# Patient Record
Sex: Female | Born: 1985 | Race: White | Hispanic: No | Marital: Single | Smoking: Current every day smoker
Health system: Southern US, Community
[De-identification: ages and names within clinical notes are randomized; demographics above are authoritative.]

## PROBLEM LIST (undated history)

## (undated) DIAGNOSIS — I10 Essential (primary) hypertension: Secondary | ICD-10-CM

## (undated) DIAGNOSIS — N200 Calculus of kidney: Secondary | ICD-10-CM

## (undated) HISTORY — DX: Essential (primary) hypertension: I10

## (undated) HISTORY — PX: OTHER SURGICAL HISTORY: SHX169

---

## 2014-01-30 ENCOUNTER — Emergency Department (HOSPITAL_COMMUNITY)
Admission: EM | Admit: 2014-01-30 | Discharge: 2014-01-30 | Disposition: A | Discharge status: Home / Self Care | Payer: Self-pay | Attending: Emergency Medicine | Admitting: Emergency Medicine

## 2014-01-30 ENCOUNTER — Emergency Department (HOSPITAL_COMMUNITY): Payer: Self-pay

## 2014-01-30 ENCOUNTER — Encounter (HOSPITAL_COMMUNITY): Payer: Self-pay | Admitting: Emergency Medicine

## 2014-01-30 DIAGNOSIS — Z79899 Other long term (current) drug therapy: Secondary | ICD-10-CM | POA: Insufficient documentation

## 2014-01-30 DIAGNOSIS — R110 Nausea: Secondary | ICD-10-CM | POA: Insufficient documentation

## 2014-01-30 DIAGNOSIS — N949 Unspecified condition associated with female genital organs and menstrual cycle: Secondary | ICD-10-CM

## 2014-01-30 DIAGNOSIS — Y849 Medical procedure, unspecified as the cause of abnormal reaction of the patient, or of later complication, without mention of misadventure at the time of the procedure: Secondary | ICD-10-CM | POA: Insufficient documentation

## 2014-01-30 DIAGNOSIS — T8384XA Pain from genitourinary prosthetic devices, implants and grafts, initial encounter: Secondary | ICD-10-CM

## 2014-01-30 DIAGNOSIS — T8389XA Other specified complication of genitourinary prosthetic devices, implants and grafts, initial encounter: Secondary | ICD-10-CM | POA: Insufficient documentation

## 2014-01-30 DIAGNOSIS — Z87442 Personal history of urinary calculi: Secondary | ICD-10-CM | POA: Insufficient documentation

## 2014-01-30 DIAGNOSIS — F17200 Nicotine dependence, unspecified, uncomplicated: Secondary | ICD-10-CM | POA: Insufficient documentation

## 2014-01-30 DIAGNOSIS — Z3202 Encounter for pregnancy test, result negative: Secondary | ICD-10-CM | POA: Insufficient documentation

## 2014-01-30 DIAGNOSIS — R350 Frequency of micturition: Secondary | ICD-10-CM | POA: Insufficient documentation

## 2014-01-30 DIAGNOSIS — IMO0002 Reserved for concepts with insufficient information to code with codable children: Secondary | ICD-10-CM | POA: Insufficient documentation

## 2014-01-30 DIAGNOSIS — R300 Dysuria: Secondary | ICD-10-CM | POA: Insufficient documentation

## 2014-01-30 DIAGNOSIS — N9489 Other specified conditions associated with female genital organs and menstrual cycle: Secondary | ICD-10-CM | POA: Insufficient documentation

## 2014-01-30 HISTORY — DX: Calculus of kidney: N20.0

## 2014-01-30 LAB — URINALYSIS, ROUTINE W REFLEX MICROSCOPIC
Bilirubin Urine: NEGATIVE
Glucose, UA: NEGATIVE mg/dL
Hgb urine dipstick: NEGATIVE
Ketones, ur: NEGATIVE mg/dL
LEUKOCYTES UA: NEGATIVE
NITRITE: NEGATIVE
PH: 7 (ref 5.0–8.0)
Protein, ur: NEGATIVE mg/dL
Specific Gravity, Urine: 1.006 (ref 1.005–1.030)
Urobilinogen, UA: 0.2 mg/dL (ref 0.0–1.0)

## 2014-01-30 LAB — WET PREP, GENITAL
Trich, Wet Prep: NONE SEEN
Yeast Wet Prep HPF POC: NONE SEEN

## 2014-01-30 LAB — PREGNANCY, URINE: Preg Test, Ur: NEGATIVE

## 2014-01-30 MED ORDER — ONDANSETRON 4 MG PO TBDP
4.0000 mg | ORAL_TABLET | Freq: Once | ORAL | Status: AC
  Administered 2014-01-30: 4 mg via ORAL
  Filled 2014-01-30: qty 1

## 2014-01-30 MED ORDER — PHENAZOPYRIDINE HCL 200 MG PO TABS: 200.0000 mg | ORAL_TABLET | Freq: Three times a day (TID) | ORAL | Status: DC

## 2014-01-30 NOTE — ED Provider Notes (Signed)
CSN: [id number]     Arrival date & time 01/30/14  1639 History   First MD Initiated Contact with Patient 01/30/14 2001     Chief Complaint  Patient presents with  . Urinary Frequency  . Dysuria  . painful intercourse      (Consider location/radiation/quality/duration/timing/severity/associated sxs/prior Treatment) HPI Pt is a 28yo female with hx of renal stones presenting to ED c/o urinary frequency associated with burning with urination and intercourse. Also c/o nausea. Pt states urinary symptoms and external vaginal burning started about 3 days ago, however, reports painful intercourse since having an IUD placed about 1 year ago in [place].  Pt states she moved to the area a few months ago and has not established a PCP or GYN yet. Pt also reports "little bumps" near vaginal opening that she noticed 3 days.  Reports not using condoms during intercourse as she has been with the same partner "for a while"  But states "it wouldn't hurt" to check for STDs.  Denies fever or vomiting.   Past Medical History  Diagnosis Date  . Kidney stone    Past Surgical History  Procedure Laterality Date  . Cesarean section     No family history on file. History  Substance Use Topics  . Smoking status: Current Every Day Smoker  . Smokeless tobacco: Not on file  . Alcohol Use: Yes   OB History   Grav Para Term Preterm Abortions TAB SAB Ect Mult Living                 Review of Systems  Constitutional: Negative for fever and chills.  Gastrointestinal: Positive for nausea. Negative for vomiting, abdominal pain and diarrhea.  Genitourinary: Positive for dysuria, urgency, frequency, vaginal discharge, vaginal pain and menstrual problem ( slight brown discharge with ovulation). Negative for hematuria, flank pain, decreased urine volume, vaginal bleeding and pelvic pain.  Musculoskeletal: Negative for back pain and myalgias.  All other systems reviewed and are negative.     Allergies  Review  of patient's allergies indicates no known allergies.  Home Medications   Prior to Admission medications   Medication Sig Start Date End Date Taking? Authorizing Provider  phenazopyridine (PYRIDIUM) 200 MG tablet Take 1 tablet (200 mg total) by mouth 3 (three) times daily. 01/30/14   Junius FinnerErin O'Malley, PA-C   BP 163/108  Pulse 70  Temp(Src) 98.5 F (36.9 C) (Oral)  Resp 18  SpO2 100%  LMP 01/02/2014 Physical Exam  Nursing note and vitals reviewed. Constitutional: She appears well-developed and well-nourished. No distress.  Pt sitting on exam bed, appears well, non-toxic. NAD.  HENT:  Head: Normocephalic and atraumatic.  Eyes: Conjunctivae are normal. No scleral icterus.  Neck: Normal range of motion.  Cardiovascular: Normal rate, regular rhythm and normal heart sounds.   Pulmonary/Chest: Effort normal and breath sounds normal. No respiratory distress. She has no wheezes. She has no rales. She exhibits no tenderness.  Abdominal: Soft. Bowel sounds are normal. She exhibits no distension and no mass. There is no tenderness. There is no rebound and no guarding.  Soft, non-distended, non-tender. No CVAT  Genitourinary:  Chaperoned exam. External genitalia, 2 pin-point open sores. One on right labia major, one right side of vaginal opening.  Vaginal exam- minimal to moderate amount of thick white-mucous discharge. No CMT, adnexal tenderness or masses. No vaginal bleeding. Unable to visualize cervical os.   Musculoskeletal: Normal range of motion.  Neurological: She is alert.  Skin: Skin is warm and dry. She  is not diaphoretic.    ED Course  Procedures (including critical care time) Labs Review Labs Reviewed  WET PREP, GENITAL - Abnormal; Notable for the following:    Clue Cells Wet Prep HPF POC FEW (*)    WBC, Wet Prep HPF POC FEW (*)    All other components within normal limits  GC/CHLAMYDIA PROBE AMP  URINALYSIS, ROUTINE W REFLEX MICROSCOPIC  PREGNANCY, URINE  RPR  HIV ANTIBODY  (ROUTINE TESTING)  HSV 2 ANTIBODY, IGG  HSV 1 ANTIBODY, IGG    Imaging Review Dg Abd 1 View  01/30/2014   CLINICAL DATA:  Urinary frequency. Dysuria. Evaluate birth control location.  EXAM: ABDOMEN - 1 VIEW  COMPARISON:  None.  FINDINGS: Intrauterine device is localized to the central pelvis. Specific information regarding positioning is not possible radiographically. The bowel gas pattern is normal. No radio-opaque calculi or other significant radiographic abnormality are seen.  IMPRESSION: Nonobstructive bowel gas pattern. Intrauterine device demonstrated in the central pelvis.   Electronically Signed   By: Burman NievesWilliam  Stevens M.D.   On: 01/30/2014 22:22     EKG Interpretation None      MDM   Final diagnoses:  Dysuria  Genital lesion, female  Pain due to intrauterine contraceptive device (IUD), initial encounter    Pt is a 28yo female c/o pain with intercourse since having IUD placed 1 year ago with worsening urinary symptoms of burning with urination and external genital lesions within last 3 days.  Pt does report shaving but unsure if lesions are from her shaving. Pt has minimal concern for STDs however states, while she is here she would like to be checked. On exam, pt does have 2 pinpoint open lesions spaced out on right side of external genitalia.  Do not appear vesicular in nature.  Not classic appearance for herpes.  STD panel with HIV, RPR, GC/chlamydia, and HSV 1 and HSV 2 tests ordered.  Wet prep: unremarkable.  UA: WNL Urine preg: negative.  KUB confirmed placement IUD.  Pt states she was able to palpate IUD strings, although not able to be visualized during pelvic exam.  Discussed pt with Dr. Lynelle DoctorKnapp, will discharge pt home with pyridium and advised [facility]. Return precautions provided. Pt verbalized understanding and agreement with tx plan.     Junius Finnerrin O'Malley, PA-C 01/31/14 [phone]

## 2014-01-30 NOTE — Discharge Instructions (Signed)
For skin sores and dysuria, you may find relief taking a warm bath with baking soda added to water.  You may also cover open sores with baby powder to help protect from moisture. Be sure to follow up with a gynecologist for recheck of dysuria and possible IUD removal if symptoms not improving.

## 2014-01-30 NOTE — ED Notes (Signed)
Vangie BickerErin O, PA at bedside.

## 2014-01-30 NOTE — ED Notes (Signed)
Pt c/o urinary frequency, burning and painful intercourse. Denies bleeding, states she did have slight brown discharge during ovulation. Pt states she recently got IUD a year ago.

## 2014-01-31 LAB — HIV ANTIBODY (ROUTINE TESTING W REFLEX): HIV 1&2 Ab, 4th Generation: NONREACTIVE

## 2014-01-31 LAB — RPR

## 2014-01-31 LAB — GC/CHLAMYDIA PROBE AMP
CT Probe RNA: NEGATIVE
GC Probe RNA: NEGATIVE

## 2014-01-31 LAB — HSV 1 ANTIBODY, IGG: HSV 1 Glycoprotein G Ab, IgG: <0.1 IV

## 2014-01-31 LAB — HSV 2 ANTIBODY, IGG: HSV 2 Glycoprotein G Ab, IgG: 0.55 IV

## 2014-01-31 NOTE — ED Provider Notes (Signed)
Medical screening examination/treatment/procedure(s) were performed by non-physician practitioner and as supervising physician I was immediately available for consultation/collaboration.   EKG Interpretation None      Devoria AlbeIva Knapp, MD, Armando GangFACEP   Ward GivensIva L Knapp, MD 01/31/14 [phone]

## 2014-02-02 ENCOUNTER — Telehealth (HOSPITAL_COMMUNITY): Payer: Self-pay

## 2015-07-03 DIAGNOSIS — F900 Attention-deficit hyperactivity disorder, predominantly inattentive type: Secondary | ICD-10-CM | POA: Insufficient documentation

## 2015-12-03 IMAGING — CR DG ABDOMEN 1V
1 series · 1 of 1 positions shown · non-contrast
Comparison: None.

CLINICAL DATA: Urinary frequency. Dysuria. Evaluate birth control
location.

EXAM:
ABDOMEN - 1 VIEW

[t abdomen supine]
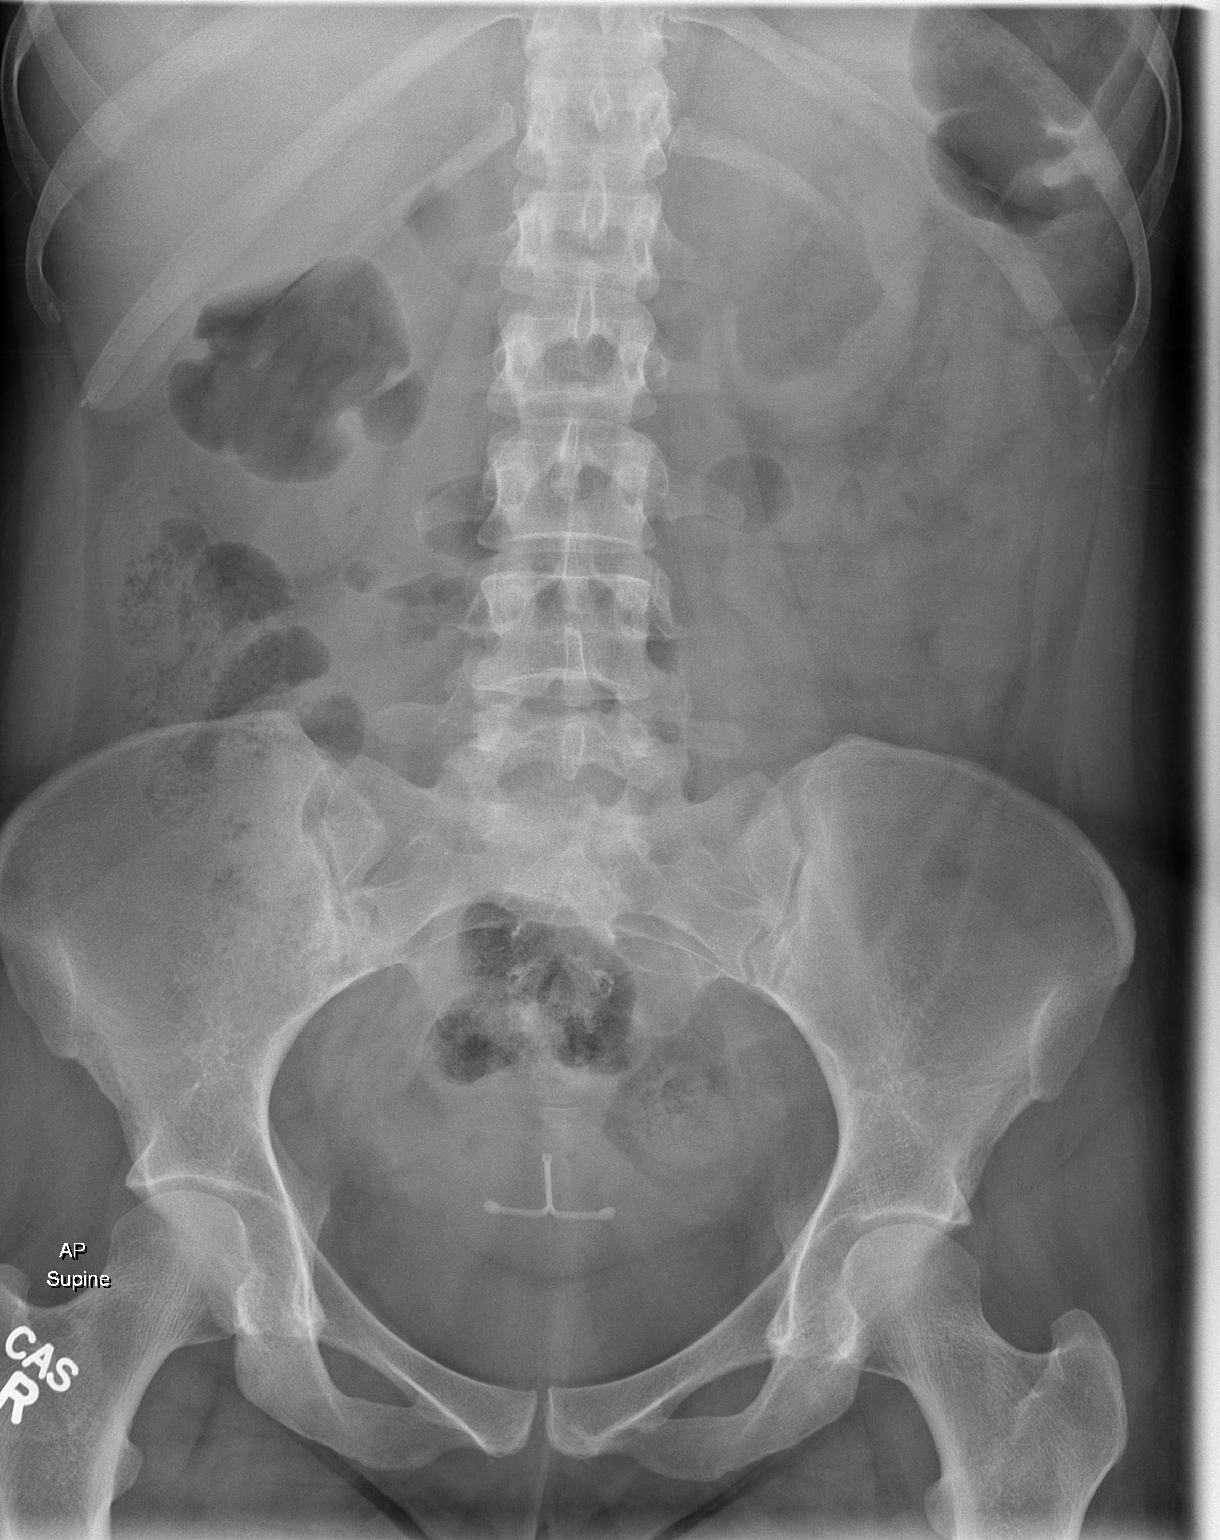

[1 of 1 positions shown; findings below may reference images not displayed]

FINDINGS: Intrauterine device is localized to the central pelvis. Specific
information regarding positioning is not possible radiographically.
The bowel gas pattern is normal. No radio-opaque calculi or other
significant radiographic abnormality are seen.
IMPRESSION: Nonobstructive bowel gas pattern. Intrauterine device demonstrated
in the central pelvis.

## 2016-09-17 DIAGNOSIS — N200 Calculus of kidney: Secondary | ICD-10-CM | POA: Insufficient documentation

## 2016-09-17 DIAGNOSIS — F1290 Cannabis use, unspecified, uncomplicated: Secondary | ICD-10-CM | POA: Insufficient documentation

## 2016-09-17 DIAGNOSIS — Z720 Tobacco use: Secondary | ICD-10-CM | POA: Insufficient documentation

## 2020-08-11 DIAGNOSIS — I639 Cerebral infarction, unspecified: Secondary | ICD-10-CM

## 2020-08-11 HISTORY — DX: Cerebral infarction, unspecified: I63.9

## 2020-12-20 ENCOUNTER — Emergency Department
Admission: EM | Admit: 2020-12-20 | Discharge: 2020-12-20 | Disposition: A | Discharge status: Home / Self Care | Payer: Medicaid Other | Attending: Emergency Medicine | Admitting: Emergency Medicine

## 2020-12-20 ENCOUNTER — Encounter: Payer: Self-pay | Admitting: Emergency Medicine

## 2020-12-20 ENCOUNTER — Other Ambulatory Visit: Payer: Self-pay

## 2020-12-20 DIAGNOSIS — Z5321 Procedure and treatment not carried out due to patient leaving prior to being seen by health care provider: Secondary | ICD-10-CM | POA: Insufficient documentation

## 2020-12-20 DIAGNOSIS — F329 Major depressive disorder, single episode, unspecified: Secondary | ICD-10-CM | POA: Diagnosis not present

## 2020-12-20 NOTE — ED Notes (Addendum)
Pt was sat into recliner in 24H. Acknowledged patient and let her know that the doctor will be around to see her shortly. This RN cleaning room 23 at the time. Pt asks if she can be moved into room 24. Told pt no due to patient currently being in that room. Pt begins cursing and states " I can't sit out here listening to that fucking shit" in reference to monitors. Informed pt that she was placed in 24H to see a doctor because of lack of other rooms at the time and that the doctor would like to see and evaluate her. Offered for patient to move to room 23 and explained that we would need to change her into behavioral clothing for safety measures and secure belongings. Pt refused, cursing at RN, and aggressively walked out of the emergency room. Informed that Pt in lobby requesting to call sheriff office to take her back home because that is who allegedly brought pt to ER voluntarily. Pt left with all of belongings.

## 2020-12-20 NOTE — ED Triage Notes (Signed)
Pt comes into the ED via POV c/o increased depression.  Pt denies any SI or HI but states that she has had increased depression for the past couple weeks.  Pt states she is also unsure if she is pregnant.  Pt states that her depression has increased since the COVID pandemic.  PT states she is also having a lot of nightmares.

## 2020-12-20 NOTE — ED Notes (Addendum)
Pt out to the front desk , states she needs for [place] to call for the sheriff officer to pick her up and take her back home, pt states "I cant be back there with the beep beep of the monitors, that why I am here from trauma of watch covid patients dying"

## 2021-10-24 ENCOUNTER — Ambulatory Visit (INDEPENDENT_AMBULATORY_CARE_PROVIDER_SITE_OTHER): Payer: Medicaid Other | Admitting: Family Medicine

## 2021-10-24 ENCOUNTER — Encounter: Payer: Self-pay | Admitting: Family Medicine

## 2021-10-24 ENCOUNTER — Other Ambulatory Visit: Payer: Self-pay

## 2021-10-24 VITALS — BP 160/90 | HR 67 | Ht 63.0 in | Wt 141.2 lb

## 2021-10-24 DIAGNOSIS — S069X9A Unspecified intracranial injury with loss of consciousness of unspecified duration, initial encounter: Secondary | ICD-10-CM

## 2021-10-24 DIAGNOSIS — Z8673 Personal history of transient ischemic attack (TIA), and cerebral infarction without residual deficits: Secondary | ICD-10-CM

## 2021-10-24 DIAGNOSIS — I639 Cerebral infarction, unspecified: Secondary | ICD-10-CM | POA: Insufficient documentation

## 2021-10-24 DIAGNOSIS — R350 Frequency of micturition: Secondary | ICD-10-CM | POA: Diagnosis not present

## 2021-10-24 DIAGNOSIS — I10 Essential (primary) hypertension: Secondary | ICD-10-CM | POA: Diagnosis not present

## 2021-10-24 DIAGNOSIS — F3170 Bipolar disorder, currently in remission, most recent episode unspecified: Secondary | ICD-10-CM

## 2021-10-24 MED ORDER — LISINOPRIL 10 MG PO TABS: 10.0000 mg | ORAL_TABLET | Freq: Every day | ORAL | 1 refills | Status: DC

## 2021-10-24 NOTE — Assessment & Plan Note (Signed)
Patient with stated longstanding history of hypertension, was noted to be hypertensive today.  Previous management with lisinopril and other medications that she cannot recall.  She is asymptomatic from a cardiopulmonary standpoint. ? ?Exam reveals positive S1 and S2, regular rate and rhythm, no additional heart sounds, clear lung fields throughout without wheezes, rales, rhonchi, no JVD or carotid bruits, symmetric pulses of the extremities, no peripheral edema. ? ?Plan for initial dose of lisinopril 10 mg, close follow-up in 1 month, and risk stratification labs. ?

## 2021-10-24 NOTE — Progress Notes (Signed)
?  ? ?  Primary Care / Sports Medicine Office Visit ? ?Patient Information:  ?Patient ID: Shannon Ray, female DOB: 11/11/1985 Age: 36 y.o. MRN: [id number]  ? ?Shannon Ray is a pleasant 36 y.o. female presenting with the following: ? ?Chief Complaint  ?Patient presents with  ? Establish Care  ?  08/2020-stroke, brain bleed. Unable to get records from [place]. Hasn't had BP, in months. Urinates a lot since having Stroke, no pain.   ? Cough  ?  Deep cough 1 week  ? ? ?Vitals:  ? 10/24/21 1426  ?BP: (!) 160/90  ?Pulse: 67  ?SpO2: 99%  ? ?Vitals:  ? 10/24/21 1426  ?Weight: 141 lb 3.2 oz (64 kg)  ?Height: 5\' 3"  (1.6 m)  ? ?Body mass index is 25.01 kg/m?. ? ?No results found.  ? ?Independent interpretation of notes and tests performed by another provider:  ? ?None ? ?Procedures performed:  ? ?None ? ?Pertinent History, Exam, Impression, and Recommendations:  ? ?Cerebrovascular accident (CVA) (HCC) ?[name] presents for establishment of care from [facility], she does have a history of CVA, unspecified, this was managed initially in an emergency department setting with subsequent admission and care in the ICU.  Unfortunately, patient states that after awakening she left AMA and was otherwise followed by her PCP in [place].  We do not have outside medical records at this time though she does give a history of hypertension and is found to be hypertensive today. ? ?Cranial nerve testing is benign today and no overt sensorimotor deficits are noted.  That being said, plan for referral to both neurology and cardiology given her stated clinical course for further risk stratification and management as indicated. ? ?Frequency of urination ?Noted over the past 2 days, denies any overt dysuria, no fevers, no chills, no discharge.  Denies any new exposures or risk factors otherwise.  Plan for urinalysis with reflex to culture and labs. ? ?Hypertension ?Patient with stated longstanding history of hypertension, was noted to be  hypertensive today.  Previous management with lisinopril and other medications that she cannot recall.  She is asymptomatic from a cardiopulmonary standpoint. ? ?Exam reveals positive S1 and S2, regular rate and rhythm, no additional heart sounds, clear lung fields throughout without wheezes, rales, rhonchi, no JVD or carotid bruits, symmetric pulses of the extremities, no peripheral edema. ? ?Plan for initial dose of lisinopril 10 mg, close follow-up in 1 month, and risk stratification labs.  ? ?Orders & Medications ?Meds ordered this encounter  ?Medications  ? lisinopril (ZESTRIL) 10 MG tablet  ?  Sig: Take 1 tablet (10 mg total) by mouth daily.  ?  Dispense:  30 tablet  ?  Refill:  1  ? ?Orders Placed This Encounter  ?Procedures  ? Apo A1 + B + Ratio  ? CBC  ? Comprehensive metabolic panel  ? Lipid panel  ? TSH  ? UA/M w/rflx Culture, Comp  ? Ambulatory referral to Gynecology  ? Ambulatory referral to Cardiology  ? Ambulatory referral to Neurology  ?  ? ?Return in about 4 weeks (around 11/21/2021) for Annual Physical.  ?  ? ?11/23/2021, MD ? ? Primary Care Sports Medicine ?[facility] ?[facility]  ? ?

## 2021-10-24 NOTE — Patient Instructions (Signed)
-   Obtain fasting labs with orders provided ?- Restart lisinopril at 10 mg daily ?- Referral coordinator will contact you in regards to scheduling a follow-up visit with neurology and cardiology ?- Return for annual physical in 4 weeks ?- Contact us for any questions between now and then ?

## 2021-10-24 NOTE — Assessment & Plan Note (Signed)
Noted over the past 2 days, denies any overt dysuria, no fevers, no chills, no discharge.  Denies any new exposures or risk factors otherwise.  Plan for urinalysis with reflex to culture and labs. ?

## 2021-10-24 NOTE — Assessment & Plan Note (Signed)
Ms. Bonus presents for establishment of care from [facility], she does have a history of CVA, unspecified, this was managed initially in an emergency department setting with subsequent admission and care in the ICU.  Unfortunately, patient states that after awakening she left AMA and was otherwise followed by her PCP in [place].  We do not have outside medical records at this time though she does give a history of hypertension and is found to be hypertensive today. ? ?Cranial nerve testing is benign today and no overt sensorimotor deficits are noted.  That being said, plan for referral to both neurology and cardiology given her stated clinical course for further risk stratification and management as indicated. ?

## 2021-10-25 LAB — LIPID PANEL
Chol/HDL Ratio: 4 ratio (ref 0.0–4.4)
Cholesterol, Total: 226 mg/dL — ABNORMAL HIGH (ref 100–199)
HDL: 57 mg/dL (ref >39)
LDL Chol Calc (NIH): 145 mg/dL — ABNORMAL HIGH (ref 0–99)
Triglycerides: 133 mg/dL (ref 0–149)
VLDL Cholesterol Cal: 24 mg/dL (ref 5–40)

## 2021-10-25 LAB — UA/M W/RFLX CULTURE, COMP
Bilirubin, UA: NEGATIVE
Glucose, UA: NEGATIVE
Ketones, UA: NEGATIVE
Leukocytes,UA: NEGATIVE
Nitrite, UA: NEGATIVE
Protein,UA: NEGATIVE
RBC, UA: NEGATIVE
Specific Gravity, UA: 1.011 (ref 1.005–1.030)
Urobilinogen, Ur: 0.2 mg/dL (ref 0.2–1.0)
pH, UA: 7.5 (ref 5.0–7.5)

## 2021-10-25 LAB — COMPREHENSIVE METABOLIC PANEL
ALT: 6 IU/L (ref 0–32)
AST: 15 IU/L (ref 0–40)
Albumin/Globulin Ratio: 1.8 (ref 1.2–2.2)
Albumin: 4.6 g/dL (ref 3.8–4.8)
Alkaline Phosphatase: 66 IU/L (ref 44–121)
BUN/Creatinine Ratio: 14 (ref 9–23)
BUN: 10 mg/dL (ref 6–20)
Bilirubin Total: 0.3 mg/dL (ref 0.0–1.2)
CO2: 22 mmol/L (ref 20–29)
Calcium: 9.1 mg/dL (ref 8.7–10.2)
Chloride: 101 mmol/L (ref 96–106)
Creatinine, Ser: 0.71 mg/dL (ref 0.57–1.00)
Globulin, Total: 2.5 g/dL (ref 1.5–4.5)
Glucose: 82 mg/dL (ref 70–99)
Potassium: 4.2 mmol/L (ref 3.5–5.2)
Sodium: 136 mmol/L (ref 134–144)
Total Protein: 7.1 g/dL (ref 6.0–8.5)
eGFR: 112 mL/min/{1.73_m2} (ref >59)

## 2021-10-25 LAB — CBC
Hematocrit: 41.9 % (ref 34.0–46.6)
Hemoglobin: 14.2 g/dL (ref 11.1–15.9)
MCH: 32.4 pg (ref 26.6–33.0)
MCHC: 33.9 g/dL (ref 31.5–35.7)
MCV: 96 fL (ref 79–97)
Platelets: 271 10*3/uL (ref 150–450)
RBC: 4.38 x10E6/uL (ref 3.77–5.28)
RDW: 12.2 % (ref 11.7–15.4)
WBC: 6.7 10*3/uL (ref 3.4–10.8)

## 2021-10-25 LAB — MICROSCOPIC EXAMINATION
Bacteria, UA: NONE SEEN
Casts: NONE SEEN /lpf
WBC, UA: NONE SEEN /hpf (ref 0–5)

## 2021-10-25 LAB — TSH: TSH: 2.75 u[IU]/mL (ref 0.450–4.500)

## 2021-10-25 LAB — APO A1 + B + RATIO
Apolipo. B/A-1 Ratio: 0.8 ratio — ABNORMAL HIGH (ref 0.0–0.6)
Apolipoprotein A-1: 144 mg/dL (ref 116–209)
Apolipoprotein B: 118 mg/dL — ABNORMAL HIGH (ref <90)

## 2021-10-28 ENCOUNTER — Other Ambulatory Visit: Payer: Self-pay | Admitting: Family Medicine

## 2021-10-28 DIAGNOSIS — E782 Mixed hyperlipidemia: Secondary | ICD-10-CM

## 2021-10-28 MED ORDER — ROSUVASTATIN CALCIUM 10 MG PO TABS: 10.0000 mg | ORAL_TABLET | Freq: Every day | ORAL | 3 refills | Status: DC

## 2021-10-29 ENCOUNTER — Other Ambulatory Visit: Payer: Self-pay | Admitting: Family Medicine

## 2021-10-29 DIAGNOSIS — Z30011 Encounter for initial prescription of contraceptive pills: Secondary | ICD-10-CM

## 2021-10-29 MED ORDER — NORETHIN-ETH ESTRAD-FE BIPHAS 1 MG-10 MCG / 10 MCG PO TABS: 1.0000 | ORAL_TABLET | Freq: Every day | ORAL | 11 refills | Status: DC

## 2021-10-29 NOTE — Progress Notes (Signed)
Pt hasn't been on any in a couple years.

## 2021-11-11 ENCOUNTER — Encounter: Payer: Self-pay | Admitting: Licensed Practical Nurse

## 2021-11-11 ENCOUNTER — Other Ambulatory Visit (HOSPITAL_COMMUNITY)
Admission: RE | Admit: 2021-11-11 | Discharge: 2021-11-11 | Disposition: A | Discharge status: Home / Self Care | Payer: Medicaid Other | Source: Ambulatory Visit | Attending: Licensed Practical Nurse | Admitting: Licensed Practical Nurse

## 2021-11-11 ENCOUNTER — Ambulatory Visit: Payer: Medicaid Other | Admitting: Licensed Practical Nurse

## 2021-11-11 VITALS — BP 122/70 | Ht 63.0 in | Wt 140.0 lb

## 2021-11-11 DIAGNOSIS — Z30011 Encounter for initial prescription of contraceptive pills: Secondary | ICD-10-CM | POA: Diagnosis not present

## 2021-11-11 DIAGNOSIS — Z113 Encounter for screening for infections with a predominantly sexual mode of transmission: Secondary | ICD-10-CM

## 2021-11-11 DIAGNOSIS — N926 Irregular menstruation, unspecified: Secondary | ICD-10-CM

## 2021-11-11 DIAGNOSIS — Z124 Encounter for screening for malignant neoplasm of cervix: Secondary | ICD-10-CM | POA: Diagnosis present

## 2021-11-11 DIAGNOSIS — Z01419 Encounter for gynecological examination (general) (routine) without abnormal findings: Secondary | ICD-10-CM | POA: Diagnosis present

## 2021-11-11 LAB — POCT URINE PREGNANCY: Preg Test, Ur: NEGATIVE

## 2021-11-11 MED ORDER — NORETHINDRONE 0.35 MG PO TABS: 1.0000 | ORAL_TABLET | Freq: Every day | ORAL | 11 refills | Status: DC

## 2021-11-11 NOTE — Progress Notes (Signed)
? ? ? ?Gynecology Annual Exam   ?PCP: Jerrol BananaMatthews, Jason J, MD ? ?Chief Complaint:  ?Chief Complaint  ?Patient presents with  ? Gynecologic Exam  ? ? ?History of Present Illness: Patient is a 36 y.o. G3P0 presents for annual exam. The patient is her today for a pap and to discuss birthcontrol. She was prescribed OCP's by her PCP.  ? ?LMP: Patient's last menstrual period was 10/13/2021 (approximate). ?Average Interval: regular, 28 days ?Duration of flow:  4-5  days ?Heavy Menses: no ?Clots: no ?Intermenstrual Bleeding: no ?Postcoital Bleeding: no ?Dysmenorrhea: no ? ?The patient is sexually active with 1 female partner.  She currently uses none for contraception. She denies dyspareunia.  The patient does perform self breast exams.  There is no notable family history of breast or ovarian cancer in her family. ? ?The patient wears seatbelts: yes.   The patient has regular exercise:  tries to walk a mile a day  .   ? ?The patient denies current symptoms of depression.  Does have hx of bipolar and ADHD ?Currently not working ?Lives with her child's father, feels safe ?Describes stress as low, likes to watch TV and go for walks to decrease stress. ?Does smoke 10 cigarettes/day ?Hx of stoke ?CHTN  ? ?Review of Systems: Review of Systems  ?Constitutional:  Negative for chills and fever.  ?Respiratory:  Positive for cough.   ?Genitourinary:  Positive for frequency.  ?Musculoskeletal:   ?     Left breast pain secondary to cough   ?Neurological: Negative.   ?Endo/Heme/Allergies:   ?     Hot flashes   ?Psychiatric/Behavioral: Negative.    ? ?Past Medical History:  ?Patient Active Problem List  ? Diagnosis Date Noted  ? Cerebrovascular accident (CVA) (HCC) 10/24/2021  ? Frequency of urination 10/24/2021  ? SAH (subarachnoid hemorrhage) (HCC) 08/10/2020  ? Missed abortion 11/17/2019  ? Bipolar disorder, currently in remission, most recent episode unspecified (HCC) 11/18/2016  ? Vitamin D deficiency 10/08/2016  ? ADD (attention  deficit disorder) 09/17/2016  ?  Formatting of this note might be different from the original. ?was taking Adderall 30mg  twice daily. Has tried to discontinue, but states she is worse without the medication. Currently taking 1/4 tab daily. Managed by Psychiatrist.  Appt scheduled with Dr. Marvetta GibbonsBurleson 09/17/16 to discuss. ?  ? 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  Formatting of this note might be different from the original. 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 States she had pneumonia as a child & was hospitalized with fever up to 106 degrees. ?  ? 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  Formatting of this note might be different from the original. ?Diagnosed 2013 during work up for renal stones ?  ? History of C-section 09/17/2016  ?  Formatting of this note might be different from the original. ?LTCS @ 30 weeks with G1 for Severe Pre-E: undecided if she wants Repeat or VBAC. [facility] [place] ?  ? History of severe pre-eclampsia 09/17/2016  ?  Formatting of this note might be different from the original. ?Severe Pre-Eclampsia with G1 requiring emergency C-section @ 30 weeks. Treated with Magnesium Sulfate. ?  ? Marijuana use 09/17/2016  ?  Formatting of this note might be different from the original. ?Last used 08/11/16 ?UDS 09/16/16 ?Needs UDS 28 & 36 weeks ?  ? Polysubstance abuse (HCC) 09/17/2016  ?  Formatting of this note might be different from the original. ?  2013 Heroin, opiates ?admission to detox facilities ?  ? Renal stones 09/17/2016  ?  Formatting of this note might be different from the original. ?2013 ?  ? Smoker 09/17/2016  ?  Formatting of this note might be different from the original. ?2 cigs/day, plans to quit ?  ? Hypertension 12/05/2009  ? 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  Formatting of this note might be different from the original. ?Narcolepsy ? ?10/1 IMO update ?  ? Obsessive-compulsive disorder 11/07/2009  ?  Formatting of  this note might be different from the original. ?Obsessive Compulsive Disorder ? ?10/1 IMO update ?  ? ? ?Past Surgical History:  ?Past Surgical History:  ?Procedure Laterality Date  ? BRAIN SURGERY N/A 08/2020  ? CESAREAN SECTION    ? kidney stone N/A   ? ? ?Gynecologic History:  ?Patient's last menstrual period was 10/13/2021 (approximate). ?Contraception: none ?Last Pap: Results were: no abnormalities collected about 3 years ago at [facility]  ? ?Obstetric History: G3P0 ? ?Family History:  ?Family History  ?Problem Relation Age of Onset  ? Hypertension Mother   ? Brain cancer Paternal Aunt   ? ? ?Social History:  ?Social History  ? ?Socioeconomic History  ? Marital status: Single  ?  Spouse name: Not on file  ? Number of children: Not on file  ? Years of education: Not on file  ? Highest education level: Not on file  ?Occupational History  ? Not on file  ?Tobacco Use  ? Smoking status: Every Day  ?  Types: Cigarettes  ? Smokeless tobacco: Never  ?Vaping Use  ? Vaping Use: Never used  ?Substance and Sexual Activity  ? Alcohol use: Not Currently  ? Drug use: Not Currently  ? Sexual activity: Yes  ?Other Topics Concern  ? Not on file  ?Social History Narrative  ? Not on file  ? ?Social Determinants of Health  ? ?Financial Resource Strain: Not on file  ?Food Insecurity: Not on file  ?Transportation Needs: Not on file  ?Physical Activity: Not on file  ?Stress: Not on file  ?Social Connections: Not on file  ?Intimate Partner Violence: Not on file  ? ? ?Allergies:  ?No Known Allergies ? ?Medications: ?Prior to Admission medications   ?Medication Sig Start Date End Date Taking? Authorizing Provider  ?lisinopril (ZESTRIL) 10 MG tablet Take 1 tablet (10 mg total) by mouth daily. 10/24/21  Yes [name], MD  ?norethindrone (ORTHO MICRONOR) 0.35 MG tablet Take 1 tablet (0.35 mg total) by mouth daily. 11/11/21  Yes Taji Sather, Courtney Heys, CNM  ?phenazopyridine (PYRIDIUM) 200 MG tablet Take 1 tablet (200 mg  total) by mouth 3 (three) times daily. ?Patient not taking: Reported on 10/24/2021 01/30/14   Lurene Shadow, PA-C  ?rosuvastatin (CRESTOR) 10 MG tablet Take 1 tablet (10 mg total) by mouth daily. ?Patient not taking: Reported on 11/11/2021 10/28/21   [name], MD  ? ? ?Physical Exam ?Vitals: Blood pressure 122/70, height 5\' 3"  (1.6 m), weight 140 lb (63.5 kg), last menstrual period 10/13/2021. ? ?General: NAD ?HEENT: normocephalic, anicteric ?Thyroid: no enlargement, no palpable nodules ?Pulmonary: No increased work of breathing, CTAB ?Cardiovascular: RRR, distal pulses 2+ ?Breast: Breast symmetrical, no tenderness, no palpable nodules or masses, no skin or nipple retraction present, no nipple discharge.  No axillary or supraclavicular lymphadenopathy. ?Abdomen: NABS, soft, non-tender, non-distended.  Umbilicus without lesions.  No hepatomegaly, splenomegaly or masses palpable. No evidence of hernia  ?Genitourinary: ? External: Normal  external female genitalia.  Normal urethral meatus, normal Bartholin's and Skene's glands.   ? Vagina: Normal vaginal mucosa, no evidence of prolapse.   ? Cervix: Grossly normal in appearance, no bleeding ? Uterus: Non-enlarged, mobile, normal contour.  No CMT ? Adnexa: ovaries non-enlarged, no adnexal masses ? Rectal: deferred ? Lymphatic: no evidence of inguinal lymphadenopathy ?Extremities: no edema, erythema, or tenderness ?Neurologic: Grossly intact ?Psychiatric: mood appropriate, affect full ? ?Female chaperone present for pelvic and breast  portions of the physical exam ? ? ? ?Assessment: 36 y.o. G3P0 routine annual exam ? ?Plan: ?Problem List Items Addressed This Visit   ?None ?Visit Diagnoses   ? ? Well woman exam    -  Primary  ? Relevant Medications  ? norethindrone (ORTHO MICRONOR) 0.35 MG tablet  ? Other Relevant Orders  ? HEP, RPR, HIV Panel  ? Hepatitis C antibody  ? Cytology - PAP  ? Screening examination for venereal disease      ? Relevant Orders  ? HEP, RPR, HIV  Panel  ? Hepatitis C antibody  ? Cervical cancer screening      ? Relevant Orders  ? Cytology - PAP  ? Initiation of oral contraception      ? Relevant Medications  ? norethindrone (ORTHO MICRONOR) 0.35 MG

## 2021-11-12 ENCOUNTER — Telehealth: Payer: Self-pay

## 2021-11-12 LAB — HEP, RPR, HIV PANEL
HIV Screen 4th Generation wRfx: NONREACTIVE
Hepatitis B Surface Ag: NEGATIVE
RPR Ser Ql: NONREACTIVE

## 2021-11-12 LAB — HEPATITIS C ANTIBODY: Hep C Virus Ab: NONREACTIVE

## 2021-11-12 NOTE — Telephone Encounter (Signed)
I contacted patient via phone to scheduled BTL consult with PC. Called and left voicemail for patient to call back to be scheduled.

## 2021-11-13 LAB — CYTOLOGY - PAP
Chlamydia: NEGATIVE
Comment: NEGATIVE
Comment: NEGATIVE
Comment: NEGATIVE
Comment: NORMAL
Diagnosis: NEGATIVE
Diagnosis: REACTIVE
High risk HPV: NEGATIVE
Neisseria Gonorrhea: NEGATIVE
Trichomonas: NEGATIVE

## 2021-11-13 NOTE — Telephone Encounter (Signed)
Patient is scheduled for 12/11/21 with PC

## 2021-11-14 ENCOUNTER — Ambulatory Visit: Payer: Medicaid Other | Admitting: Cardiology

## 2021-11-14 ENCOUNTER — Encounter: Payer: Self-pay | Admitting: Cardiology

## 2021-11-14 VITALS — BP 130/88 | HR 77 | Ht 63.0 in | Wt 140.0 lb

## 2021-11-14 DIAGNOSIS — E7800 Pure hypercholesterolemia, unspecified: Secondary | ICD-10-CM | POA: Diagnosis not present

## 2021-11-14 DIAGNOSIS — I10 Essential (primary) hypertension: Secondary | ICD-10-CM

## 2021-11-14 DIAGNOSIS — IMO0001 Reserved for inherently not codable concepts without codable children: Secondary | ICD-10-CM

## 2021-11-14 DIAGNOSIS — I639 Cerebral infarction, unspecified: Secondary | ICD-10-CM

## 2021-11-14 DIAGNOSIS — R072 Precordial pain: Secondary | ICD-10-CM

## 2021-11-14 DIAGNOSIS — F17200 Nicotine dependence, unspecified, uncomplicated: Secondary | ICD-10-CM | POA: Diagnosis not present

## 2021-11-14 NOTE — Patient Instructions (Signed)
Medication Instructions:  ?No changes at this time.  ? ?*If you need a refill on your cardiac medications before your next appointment, please call your pharmacy* ? ? ?Lab Work: ?None ? ?If you have labs (blood work) drawn today and your tests are completely normal, you will receive your results only by: ?MyChart Message (if you have MyChart) OR ?A paper copy in the mail ?If you have any lab test that is abnormal or we need to change your treatment, we will call you to review the results. ? ? ?Testing/Procedures: ?Your physician has requested that you have an echocardiogram.  ? ?Echocardiography is a painless test that uses sound waves to create images of your heart. It provides your doctor with information about the size and shape of your heart and how well your heart?s chambers and valves are working. This procedure takes approximately one hour. There are no restrictions for this procedure. ? ? ? ?Follow-Up: ?At [facility], you and your health needs are our priority.  As part of our continuing mission to provide you with exceptional heart care, we have created designated Provider Care Teams.  These Care Teams include your primary Cardiologist (physician) and Advanced Practice Providers (APPs -  Physician Assistants and Nurse Practitioners) who all work together to provide you with the care you need, when you need it. ? ? ?Your next appointment:   ?Follow up after testing.  ? ?The format for your next appointment:   ?In Person ? ?Provider:   ?Debbe Odea, MD ?

## 2021-11-14 NOTE — Progress Notes (Signed)
?Cardiology Office Note:   ? ?Date:  11/14/2021  ? ?IDPrecious Ray:  Shannon Ray, DOB [birth year], MRN [id number] ? ?PCP:  Jerrol BananaMatthews, Jason J, MD ?  ?[facility] Providers ?Cardiologist:  None    ? ?Referring MD: Jerrol BananaMatthews, Jason J, MD  ? ?Chief Complaint  ?Patient presents with  ? New Patient (Initial Visit)  ?  Referred by PCP for family CVA. Meds reviewed verbally with patient.   ? ? ?History of Present Illness:   ? ?Shannon Ray is a 36 y.o. 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 ? ?Patient states having hemorrhagic stroke 1 year ago.  She was sitting at home and suddenly her abdomen became normal.  She later fell, her daughter's father who was at the time with the patient call EMS.  [place] was taken to a hospital in [place] , and [facility] she states undergoing coil embolization.  He does not remember much from diet, states being on too many meds and left AMA. ? ?She moved to the area and establish care with PCP.  She denies shortness of breath, states having occasional nonspecific left-sided chest pain when she bends.  Recent blood work revealed elevated cholesterol, started on Crestor.  Also started on lisinopril for elevated BP. ? ?Past Medical History:  ?Diagnosis Date  ? Hypertension   ? Kidney stone   ? Stroke (cerebrum) (HCC) 08/2020  ? ? ?Past Surgical History:  ?Procedure Laterality Date  ? BRAIN SURGERY N/A 08/2020  ? CESAREAN SECTION    ? kidney stone N/A   ? ? ?Current Medications: ?Current Meds  ?Medication Sig  ? lisinopril (ZESTRIL) 10 MG tablet Take 1 tablet (10 mg total) by mouth daily.  ? norethindrone (ORTHO MICRONOR) 0.35 MG tablet Take 1 tablet (0.35 mg total) by mouth daily.  ? rosuvastatin (CRESTOR) 10 MG tablet Take 1 tablet (10 mg total) by mouth daily.  ?  ? ?Allergies:   Patient has no known allergies.  ? ?Social History  ? ?Socioeconomic History   ? Marital status: Single  ?  Spouse name: Not on file  ? Number of children: Not on file  ? Years of education: Not on file  ? Highest education level: Not on file  ?Occupational History  ? Not on file  ?Tobacco Use  ? Smoking status: Every Day  ?  Types: Cigarettes  ? Smokeless tobacco: Never  ?Vaping Use  ? Vaping Use: Never used  ?Substance and Sexual Activity  ? Alcohol use: Not Currently  ? Drug use: Not Currently  ? Sexual activity: Yes  ?Other Topics Concern  ? Not on file  ?Social History Narrative  ? Not on file  ? ?Social Determinants of Health  ? ?Financial Resource Strain: Not on file  ?Food Insecurity: Not on file  ?Transportation Needs: Not on file  ?Physical Activity: Not on file  ?Stress: Not on file  ?Social Connections: Not on file  ?  ? ?Family History: ?The patient's family history includes Brain cancer in her paternal aunt; Hypertension in her mother. ? ?ROS:   ?Please see the history of present illness.    ? All other systems reviewed and are negative. ? ?EKGs/Labs/Other Studies Reviewed:   ? ?The following studies were reviewed today: ? ? ?EKG:  EKG is  ordered today.  The ekg ordered today demonstrates normal sinus rhythm, normal ECG. ? ?Recent Labs: ?10/24/2021:  ALT 6; BUN 10; Creatinine, Ser 0.71; Hemoglobin 14.2; Platelets 271; Potassium 4.2; Sodium 136; TSH 2.750  ?Recent Lipid Panel ?   ?Component Value Date/Time  ? CHOL 226 (H) 10/24/2021 1539  ? TRIG 133 10/24/2021 1539  ? HDL 57 10/24/2021 1539  ? CHOLHDL 4.0 10/24/2021 1539  ? LDLCALC 145 (H) 10/24/2021 1539  ? ? ? ?Risk Assessment/Calculations:   ? ? ?    ? ?Physical Exam:   ? ?VS:  BP 130/88 (BP Location: Left Arm, Patient Position: Sitting, Cuff Size: Normal)   Pulse 77   Ht 5\' 3"  (1.6 m)   Wt 140 lb (63.5 kg)   SpO2 95%   BMI 24.80 kg/m?    ? ?Wt Readings from Last 3 Encounters:  ?11/14/21 140 lb (63.5 kg)  ?11/11/21 140 lb (63.5 kg)  ?10/24/21 141 lb 3.2 oz (64 kg)  ?  ? ?GEN:  Well nourished, well developed in no acute  distress ?HEENT: Normal ?NECK: No JVD; No carotid bruits ?LYMPHATICS: No lymphadenopathy ?CARDIAC: RRR, no murmurs, rubs, gallops ?RESPIRATORY:  Clear to auscultation without rales, wheezing or rhonchi  ?ABDOMEN: Soft, non-tender, non-distended ?MUSCULOSKELETAL:  No edema; No deformity  ?SKIN: Warm and dry ?NEUROLOGIC:  Alert and oriented x 3 ?PSYCHIATRIC:  Normal affect  ? ?ASSESSMENT:   ? ?1. Precordial pain   ?2. Primary hypertension   ?3. Pure hypercholesterolemia   ?4. Smoking   ?5. Cerebrovascular accident (CVA), unspecified mechanism (HCC)   ? ?PLAN:   ? ?In order of problems listed above: ? ?Chest pain, not consistent with angina.  Has several cardiac risk factors.  Get echocardiogram to evaluate cardiac function.  If normal, plan yearly monitoring.  Recommend management of cardiovascular risk factors as below. ?Hypertension, BP controlled.  Continue lisinopril. ?Hyperlipidemia, agree with starting Crestor. ?Current smoker, cessation advised. ?History of hemorrhagic CVA, brain aneurysm.  Referral to neurology as per PCP. ? ?Follow-up after echocardiogram. ? ?   ? ? ?Medication Adjustments/Labs and Tests Ordered: ?Current medicines are reviewed at length with the patient today.  Concerns regarding medicines are outlined above.  ?Orders Placed This Encounter  ?Procedures  ? EKG 12-Lead  ? ECHOCARDIOGRAM COMPLETE  ? ?No orders of the defined types were placed in this encounter. ? ? ?Patient Instructions  ?Medication Instructions:  ?No changes at this time.  ? ?*If you need a refill on your cardiac medications before your next appointment, please call your pharmacy* ? ? ?Lab Work: ?None ? ?If you have labs (blood work) drawn today and your tests are completely normal, you will receive your results only by: ?MyChart Message (if you have MyChart) OR ?A paper copy in the mail ?If you have any lab test that is abnormal or we need to change your treatment, we will call you to review the  results. ? ? ?Testing/Procedures: ?Your physician has requested that you have an echocardiogram.  ? ?Echocardiography is a painless test that uses sound waves to create images of your heart. It provides your doctor with information about the size and shape of your heart and how well your heart?s chambers and valves are working. This procedure takes approximately one hour. There are no restrictions for this procedure. ? ? ? ?Follow-Up: ?At [facility], you and your health needs are our priority.  As part of our continuing mission to provide you with exceptional heart care, we have created designated Provider Care Teams.  These Care Teams include your primary Cardiologist (physician) and Advanced Practice Providers (APPs -  Physician Assistants and Nurse Practitioners) who all work together to provide you with the care you need, when you need it. ? ? ?Your next appointment:   ?Follow up after testing.  ? ?The format for your next appointment:   ?In Person ? ?Provider:   ?Debbe Odea, MD  ? ?Signed, ?Debbe Odea, MD  ?11/14/2021 12:01 PM    ?[facility] ?

## 2021-11-21 ENCOUNTER — Encounter: Payer: Self-pay | Admitting: Family Medicine

## 2021-11-21 ENCOUNTER — Ambulatory Visit (INDEPENDENT_AMBULATORY_CARE_PROVIDER_SITE_OTHER): Payer: Medicaid Other | Admitting: Family Medicine

## 2021-11-21 VITALS — BP 118/78 | HR 78 | Ht 63.0 in | Wt 140.0 lb

## 2021-11-21 DIAGNOSIS — Z0000 Encounter for general adult medical examination without abnormal findings: Secondary | ICD-10-CM | POA: Diagnosis not present

## 2021-11-21 DIAGNOSIS — Z720 Tobacco use: Secondary | ICD-10-CM

## 2021-11-21 DIAGNOSIS — R051 Acute cough: Secondary | ICD-10-CM | POA: Diagnosis not present

## 2021-11-21 DIAGNOSIS — E782 Mixed hyperlipidemia: Secondary | ICD-10-CM

## 2021-11-21 DIAGNOSIS — I10 Essential (primary) hypertension: Secondary | ICD-10-CM | POA: Diagnosis not present

## 2021-11-21 DIAGNOSIS — I639 Cerebral infarction, unspecified: Secondary | ICD-10-CM

## 2021-11-21 MED ORDER — METHYLPREDNISOLONE 4 MG PO TBPK: ORAL_TABLET | ORAL | 0 refills | Status: AC

## 2021-11-21 NOTE — Assessment & Plan Note (Signed)
Longstanding history of the same in the setting of hypertension, hyperlipidemia, and prior CVA.  We did discuss the risk of continued tobacco use, methods for tobacco cessation including pharmacologic means.  She is not ready to quit at this time and I encouraged her to contact her office once ready to do so.  A total duration of 6 minutes was utilized for this discussion. ?

## 2021-11-21 NOTE — Patient Instructions (Signed)
-   Obtain fasting labs with orders provided (can have water or black coffee but otherwise no food or drink x 8 hours before labs) ?-Use Mucinex x5 days ?- If respiratory symptoms persist after 5 days, start steroids and continue Mucinex ?- If symptoms persist after steroids, contact our office ?- Referral coordinator will contact you regards to physical therapy/Occupational Therapy scheduling ?- Follow-up with neurology once scheduled, maintain follow-up with cardiology and OB/GYN ?- Review information provided ?- Attend eye doctor annually, dentist every 6 months, work towards or maintain 30 minutes of moderate intensity physical activity at least 5 days per week, and consume a balanced diet ?- Return in 3 months- Contact us for any questions between now and then ?

## 2021-11-21 NOTE — Assessment & Plan Note (Signed)
Noted over the past few days, nonproductive, mild congestion, no fevers, chills, cannot confirm any sick contacts, no myalgias, no treatments.  Given her reassuring examination findings have advised Mucinex course x5 days, Rx sent for prednisone for patient to initiate if symptoms continue without improvement given her smoking history.  She is to contact us for any residual symptoms beyond that timeframe, chest x-ray to be considered at that time. ?

## 2021-11-21 NOTE — Assessment & Plan Note (Signed)
At last visit on 10/24/2021 referrals to cardiology and neurology have been placed.  She has had a chance to establish with cardiology, has since also been started on both lisinopril and statin through our office.  She has upcoming visit with neurology.  Noted central tone/strength issues that patient reports are new since CVA and most likely represented a sequela of the same.  I have placed a referral to physical therapy and Occupational Therapy to begin working on the noted strength deficits as well as aiding in her ADLs.  This can be further optimized by neurology once she establishes with them. ?

## 2021-11-21 NOTE — Assessment & Plan Note (Signed)
Chronic condition noting excellent control today on lisinopril 10 mg monotherapy, has established with cardiology who does have upcoming echocardiogram scheduled.  She is asymptomatic from a cardiac standpoint. ?

## 2021-11-21 NOTE — Assessment & Plan Note (Signed)
Noted on routine risk stratification labs as part of her CVA and hypertension history/work-up.  She was started on Crestor 10 mg our office, tolerated this medication well, plan to recheck lipids and CMP in 3 months at her return. ?

## 2021-11-21 NOTE — Progress Notes (Signed)
?  ? ?Annual Physical Exam Visit ? ?Patient Information:  ?Patient ID: Shannon Ray, female DOB: 1985/10/20 Age: 36 y.o. MRN: [id number]  ? ?Subjective:  ? ?CC: Annual Physical Exam ? ?HPI:  ?Shannon Ray is here for their annual physical. ? ?I reviewed the past medical history, family history, social history, surgical history, and allergies today and changes were made as necessary.  Please see the problem list section below for additional details. ? ?Past Medical History: ?Past Medical History:  ?Diagnosis Date  ? Hypertension   ? Kidney stone   ? Stroke (cerebrum) (HCC) 08/2020  ? ?Past Surgical History: ?Past Surgical History:  ?Procedure Laterality Date  ? BRAIN SURGERY N/A 08/2020  ? CESAREAN SECTION    ? kidney stone N/A   ? ?Family History: ?Family History  ?Problem Relation Age of Onset  ? Hyperlipidemia Mother   ? Hypertension Mother   ? Hyperlipidemia Father   ? Brain cancer Paternal Aunt   ? ?Allergies: ?No Known Allergies ?Health Maintenance: ?Health Maintenance  ?Topic Date Due  ? TETANUS/TDAP  11/22/2022 (Originally 01/28/2003)  ? INFLUENZA VACCINE  03/11/2022  ? PAP SMEAR-Modifier  11/11/2024  ? Hepatitis C Screening  Completed  ? HIV Screening  Completed  ? HPV VACCINES  Aged Out  ? COVID-19 Vaccine  Discontinued  ?  ?HM Colonoscopy   ? ? This patient has no relevant Health Maintenance data.  ? ?  ? ?Medications: ?Current Outpatient Medications on File Prior to Visit  ?Medication Sig Dispense Refill  ? lisinopril (ZESTRIL) 10 MG tablet Take 1 tablet (10 mg total) by mouth daily. 30 tablet 1  ? norethindrone (ORTHO MICRONOR) 0.35 MG tablet Take 1 tablet (0.35 mg total) by mouth daily. 28 tablet 11  ? rosuvastatin (CRESTOR) 10 MG tablet Take 1 tablet (10 mg total) by mouth daily. 90 tablet 3  ? ?No current facility-administered medications on file prior to visit.  ? ? ?Review of Systems: No headache, visual changes, nausea, vomiting, diarrhea, constipation, dizziness, abdominal pain, skin rash, fevers,  chills, night sweats, swollen lymph nodes, weight loss, chest pain, body aches, joint swelling, muscle aches, shortness of breath, +cough, mood changes, visual or auditory hallucinations reported. ? ?Objective:  ? ?Vitals:  ? 11/21/21 0908  ?BP: 118/78  ?Pulse: 78  ?SpO2: 98%  ? ?Vitals:  ? 11/21/21 0908  ?Weight: 140 lb (63.5 kg)  ?Height: 5\' 3"  (1.6 m)  ? ?Body mass index is 24.8 kg/m?. ? ?General: Well Developed, well nourished, and in no acute distress.  ?Neuro: Alert and oriented x3, extra-ocular muscles intact, sensation grossly intact. Cranial nerves II through XII are grossly intact, motor, sensory, and coordinative functions are grossly intact. Diminished strength during hip/lumbosacral flexion/extension. ?HEENT: Normocephalic, atraumatic, pupils equal round reactive to light, neck supple, no masses, no lymphadenopathy, thyroid nonpalpable. Oropharynx, nasopharynx, external ear canals are unremarkable. ?Skin: Warm and dry, no rashes noted.  ?Cardiac: Regular rate and rhythm, no murmurs rubs or gallops. No peripheral edema. Pulses symmetric. ?Respiratory: Clear to auscultation bilaterally.  Intermittent cough, not using accessory muscles, speaking in full sentences.  ?Abdominal: Soft, nontender, nondistended, positive bowel sounds, no masses, no organomegaly. ?Musculoskeletal: Shoulder, elbow, wrist, hip, knee, ankle stable, and with full range of motion.   ? ?Female chaperone initials: KH present throughout the physical examination. ? ?Impression and Recommendations:  ? ?The patient was counselled, risk factors were discussed, and anticipatory guidance given. ? ?Hypertension ?Chronic condition noting excellent control today on lisinopril 10 mg monotherapy, has established  with cardiology who does have upcoming echocardiogram scheduled.  She is asymptomatic from a cardiac standpoint. ? ?Annual physical exam ?Annual examination completed, risk stratification labs ordered over interim visit and reviewed with  patient today, anticipatory guidance provided. ? ?Acute cough ?Noted over the past few days, nonproductive, mild congestion, no fevers, chills, cannot confirm any sick contacts, no myalgias, no treatments.  Given her reassuring examination findings have advised Mucinex course x5 days, Rx sent for prednisone for patient to initiate if symptoms continue without improvement given her smoking history.  She is to contact us for any residual symptoms beyond that timeframe, chest x-ray to be considered at that time. ? ?Cerebrovascular accident (CVA) (HCC) ?At last visit on 10/24/2021 referrals to cardiology and neurology have been placed.  She has had a chance to establish with cardiology, has since also been started on both lisinopril and statin through our office.  She has upcoming visit with neurology.  Noted central tone/strength issues that patient reports are new since CVA and most likely represented a sequela of the same.  I have placed a referral to physical therapy and Occupational Therapy to begin working on the noted strength deficits as well as aiding in her ADLs.  This can be further optimized by neurology once she establishes with them. ? ?Tobacco abuse ?Longstanding history of the same in the setting of hypertension, hyperlipidemia, and prior CVA.  We did discuss the risk of continued tobacco use, methods for tobacco cessation including pharmacologic means.  She is not ready to quit at this time and I encouraged her to contact her office once ready to do so.  A total duration of 6 minutes was utilized for this discussion. ? ?Mixed hyperlipidemia ?Noted on routine risk stratification labs as part of her CVA and hypertension history/work-up.  She was started on Crestor 10 mg our office, tolerated this medication well, plan to recheck lipids and CMP in 3 months at her return. ? ?Orders & Medications ?Medications:  ?Meds ordered this encounter  ?Medications  ? methylPREDNISolone (MEDROL DOSEPAK) 4 MG TBPK tablet   ?  Sig: Take 6 tablets (24 mg total) by mouth daily for 1 day, THEN 5 tablets (20 mg total) daily for 1 day, THEN 4 tablets (16 mg total) daily for 1 day, THEN 3 tablets (12 mg total) daily for 1 day, THEN 2 tablets (8 mg total) daily for 1 day, THEN 1 tablet (4 mg total) daily for 1 day.  ?  Dispense:  21 tablet  ?  Refill:  0  ? ?Orders Placed This Encounter  ?Procedures  ? Ambulatory referral to Physical Therapy  ? Ambulatory referral to Occupational Therapy  ?  ? ?Return in about 3 months (around 02/20/2022).  ? ? ?[name], MD ? ? Primary Care Sports Medicine ?[facility] ?[facility]  ? ?

## 2021-11-21 NOTE — Assessment & Plan Note (Signed)
Annual examination completed, risk stratification labs ordered over interim visit and reviewed with patient today, anticipatory guidance provided. ?

## 2021-11-22 ENCOUNTER — Ambulatory Visit (INDEPENDENT_AMBULATORY_CARE_PROVIDER_SITE_OTHER): Payer: Medicaid Other

## 2021-11-22 DIAGNOSIS — I639 Cerebral infarction, unspecified: Secondary | ICD-10-CM

## 2021-11-22 DIAGNOSIS — R072 Precordial pain: Secondary | ICD-10-CM

## 2021-11-22 LAB — ECHOCARDIOGRAM COMPLETE
AR max vel: 1.95 cm2
AV Area VTI: 1.85 cm2
AV Area mean vel: 1.82 cm2
AV Mean grad: 5 mmHg
AV Peak grad: 8.1 mmHg
Ao pk vel: 1.42 m/s
Area-P 1/2: 3.2 cm2
Calc EF: 62.2 %
S' Lateral: 3.2 cm
Single Plane A2C EF: 65.4 %
Single Plane A4C EF: 57.3 %

## 2021-12-11 ENCOUNTER — Ambulatory Visit: Payer: Medicaid Other | Admitting: Obstetrics and Gynecology

## 2021-12-12 ENCOUNTER — Telehealth: Payer: Self-pay | Admitting: Obstetrics and Gynecology

## 2021-12-12 NOTE — Telephone Encounter (Signed)
Pt is scheduled with [facility] on May 16 for Nexplanon placement.   ?

## 2021-12-13 ENCOUNTER — Ambulatory Visit: Payer: Medicaid Other | Admitting: Cardiology

## 2021-12-13 ENCOUNTER — Encounter: Payer: Self-pay | Admitting: Cardiology

## 2021-12-13 VITALS — BP 130/80 | HR 64 | Ht 63.0 in | Wt 139.0 lb

## 2021-12-13 DIAGNOSIS — F17200 Nicotine dependence, unspecified, uncomplicated: Secondary | ICD-10-CM | POA: Diagnosis not present

## 2021-12-13 DIAGNOSIS — R072 Precordial pain: Secondary | ICD-10-CM

## 2021-12-13 DIAGNOSIS — E7800 Pure hypercholesterolemia, unspecified: Secondary | ICD-10-CM | POA: Diagnosis not present

## 2021-12-13 DIAGNOSIS — I10 Essential (primary) hypertension: Secondary | ICD-10-CM

## 2021-12-13 NOTE — Progress Notes (Signed)
?Cardiology Office Note:   ? ?Date:  12/13/2021  ? ?Shannon Ray, DOB June 05, 1986, MRN [id number] ? ?PCP:  Shannon Culver, MD ?  ?[facility] Providers ?Cardiologist:  None    ? ?Referring MD: Shannon Culver, MD  ? ?Chief Complaint  ?Patient presents with  ? Other  ?  Follow up post ECHO -- Meds reviewed verbally with patient.   ? ? ?History of Present Illness:   ? ?Shannon Ray is a 36 y.o. 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  Previously seen due to chest pain. ? ?Patient had chest pain typically when she bends.  Due to risk factors, echocardiogram was obtained.  Symptoms not deemed cardiac in etiology.  She feels okay, takes medications as prescribed.  Recently started on Crestor for hyperlipidemia.  Has a follow-up appointment with primary care provider regarding this.  Also takes lisinopril, blood pressures are improved. ? ?  ?Prior notes ?Echo 11/2021 EF 60 to 65% ? she was taken to a hospital in [place] , and was subsequently transferred to [facility] where she states undergoing coil embolization.  Left left AMA from the hospital at that time. ? ? ?Past Medical History:  ?Diagnosis Date  ? Hypertension   ? Kidney stone   ? Stroke (cerebrum) ([place]) 08/2020  ? ? ?Past Surgical History:  ?Procedure Laterality Date  ? BRAIN SURGERY N/A 08/2020  ? CESAREAN SECTION    ? kidney stone N/A   ? ? ?Current Medications: ?Current Meds  ?Medication Sig  ? lisinopril (ZESTRIL) 10 MG tablet Take 1 tablet (10 mg total) by mouth daily.  ? norethindrone (ORTHO MICRONOR) 0.35 MG tablet Take 1 tablet (0.35 mg total) by mouth daily.  ? rosuvastatin (CRESTOR) 10 MG tablet Take 1 tablet (10 mg total) by mouth daily.  ?  ? ?Allergies:   Patient has no known allergies.  ? ?Social History  ? ?Socioeconomic History  ? Marital status: Single  ?  Spouse name: Not on file  ? Number of children: Not on  file  ? Years of education: Not on file  ? Highest education level: Not on file  ?Occupational History  ? Not on file  ?Tobacco Use  ? Smoking status: Every Day  ?  Types: Cigarettes  ? Smokeless tobacco: Never  ?Vaping Use  ? Vaping Use: Never used  ?Substance and Sexual Activity  ? Alcohol use: Not Currently  ? Drug use: Not Currently  ? Sexual activity: Yes  ?Other Topics Concern  ? Not on file  ?Social History Narrative  ? Not on file  ? ?Social Determinants of Health  ? ?Financial Resource Strain: Not on file  ?Food Insecurity: Not on file  ?Transportation Needs: Not on file  ?Physical Activity: Not on file  ?Stress: Not on file  ?Social Connections: Not on file  ?  ? ?Family History: ?The patient's family history includes Brain cancer in her paternal aunt; Hyperlipidemia in her father and mother; Hypertension in her mother. ? ?ROS:   ?Please see the history of present illness.    ? All other systems reviewed and are negative. ? ?EKGs/Labs/Other Studies Reviewed:   ? ?The following studies were reviewed today: ? ? ?EKG:  EKG is  ordered today.  The ekg ordered today demonstrates normal sinus rhythm, normal ECG. ? ?Recent Labs: ?10/24/2021: ALT 6; BUN 10; Creatinine, Ser 0.71; Hemoglobin 14.2; Platelets 271;  Potassium 4.2; Sodium 136; TSH 2.750  ?Recent Lipid Panel ?   ?Component Value Date/Time  ? CHOL 226 (H) 10/24/2021 1539  ? TRIG 133 10/24/2021 1539  ? HDL 57 10/24/2021 1539  ? CHOLHDL 4.0 10/24/2021 1539  ? LDLCALC 145 (H) 10/24/2021 1539  ? ? ? ?Risk Assessment/Calculations:   ? ? ?    ? ?Physical Exam:   ? ?VS:  BP 130/80 (BP Location: Left Arm, Patient Position: Sitting, Cuff Size: Normal)   Pulse 64   Ht 5\' 3"  (1.6 m)   Wt 139 lb (63 kg)   SpO2 98%   BMI 24.62 kg/m?    ? ?Wt Readings from Last 3 Encounters:  ?12/13/21 139 lb (63 kg)  ?11/21/21 140 lb (63.5 kg)  ?11/14/21 140 lb (63.5 kg)  ?  ? ?GEN:  Well nourished, well developed in no acute distress ?HEENT: Normal ?NECK: No JVD; No carotid  bruits ?LYMPHATICS: No lymphadenopathy ?CARDIAC: RRR, no murmurs, rubs, gallops ?RESPIRATORY:  Clear to auscultation without rales, wheezing or rhonchi  ?ABDOMEN: Soft, non-tender, non-distended ?MUSCULOSKELETAL:  No edema; No deformity  ?SKIN: Warm and dry ?NEUROLOGIC:  Alert and oriented x 3 ?PSYCHIATRIC:  Normal affect  ? ?ASSESSMENT:   ? ?1. Precordial pain   ?2. Primary hypertension   ?3. Pure hypercholesterolemia   ?4. Smoking   ? ? ?PLAN:   ? ?In order of problems listed above: ? ?Chest pain, not consistent with angina.  Echocardiogram 11/2021 EF 60 to 65%.  No findings to suggest etiology of chest pain.  Currently denies any symptoms. ?Hypertension, BP controlled.  Continue lisinopril. ?Hyperlipidemia, continue Crestor.  Follow-up with PCP regarding medication titration if needed. ?Current smoker, cessation advised. ? ?Follow-up as needed ? ?   ? ?Medication Adjustments/Labs and Tests Ordered: ?Current medicines are reviewed at length with the patient today.  Concerns regarding medicines are outlined above.  ?No orders of the defined types were placed in this encounter. ? ?No orders of the defined types were placed in this encounter. ? ? ?Patient Instructions  ?Medication Instructions:  ? ?Your physician recommends that you continue on your current medications as directed. Please refer to the Current Medication list given to you today. ? ?*If you need a refill on your cardiac medications before your next appointment, please call your pharmacy* ? ? ?Lab Work: ? ?None Ordered ? ?If you have labs (blood work) drawn today and your tests are completely normal, you will receive your results only by: ?MyChart Message (if you have MyChart) OR ?A paper copy in the mail ?If you have any lab test that is abnormal or we need to change your treatment, we will call you to review the results. ? ? ?Testing/Procedures: ? ?None Ordered ? ? ?Follow-Up: ?At [facility], you and your health needs are our priority.  As part of  our continuing mission to provide you with exceptional heart care, we have created designated Provider Care Teams.  These Care Teams include your primary Cardiologist (physician) and Advanced Practice Providers (APPs -  Physician Assistants and Nurse Practitioners) who all work together to provide you with the care you need, when you need it. ? ?We recommend signing up for the patient portal called "MyChart".  Sign up information is provided on this After Visit Summary.  MyChart is used to connect with patients for Virtual Visits (Telemedicine).  Patients are able to view lab/test results, encounter notes, upcoming appointments, etc.  Non-urgent messages can be sent to your provider as  well.   ?To learn more about what you can do with MyChart, go to [email or web address].   ? ?Your next appointment:  AS NEEDED ? ?Important Information About Sugar ? ? ? ? ? ?  ? ?Signed, ?Kate Sable, MD  ?12/13/2021 11:50 AM    ?[place] ?

## 2021-12-13 NOTE — Patient Instructions (Signed)
Medication Instructions:  ? ?Your physician recommends that you continue on your current medications as directed. Please refer to the Current Medication list given to you today. ? ?*If you need a refill on your cardiac medications before your next appointment, please call your pharmacy* ? ? ?Lab Work: ? ?None Ordered ? ?If you have labs (blood work) drawn today and your tests are completely normal, you will receive your results only by: ?MyChart Message (if you have MyChart) OR ?A paper copy in the mail ?If you have any lab test that is abnormal or we need to change your treatment, we will call you to review the results. ? ? ?Testing/Procedures: ? ?None Ordered ? ? ?Follow-Up: ?At [facility], you and your health needs are our priority.  As part of our continuing mission to provide you with exceptional heart care, we have created designated Provider Care Teams.  These Care Teams include your primary Cardiologist (physician) and Advanced Practice Providers (APPs -  Physician Assistants and Nurse Practitioners) who all work together to provide you with the care you need, when you need it. ? ?We recommend signing up for the patient portal called "MyChart".  Sign up information is provided on this After Visit Summary.  MyChart is used to connect with patients for Virtual Visits (Telemedicine).  Patients are able to view lab/test results, encounter notes, upcoming appointments, etc.  Non-urgent messages can be sent to your provider as well.   ?To learn more about what you can do with MyChart, go to [email or web address].   ? ?Your next appointment:  AS NEEDED ? ?Important Information About Sugar ? ? ? ? ? ? ?

## 2021-12-16 NOTE — Telephone Encounter (Signed)
Noted. Will order to arrive by apt date/time. 

## 2021-12-24 ENCOUNTER — Encounter: Payer: Self-pay | Admitting: Obstetrics and Gynecology

## 2021-12-24 ENCOUNTER — Ambulatory Visit: Payer: Medicaid Other | Admitting: Obstetrics and Gynecology

## 2021-12-24 ENCOUNTER — Encounter: Payer: Self-pay | Admitting: Family Medicine

## 2021-12-24 VITALS — BP 120/80 | Ht 63.0 in | Wt 138.0 lb

## 2021-12-24 DIAGNOSIS — B9689 Other specified bacterial agents as the cause of diseases classified elsewhere: Secondary | ICD-10-CM | POA: Diagnosis not present

## 2021-12-24 DIAGNOSIS — Z30014 Encounter for initial prescription of intrauterine contraceptive device: Secondary | ICD-10-CM

## 2021-12-24 DIAGNOSIS — N760 Acute vaginitis: Secondary | ICD-10-CM | POA: Diagnosis not present

## 2021-12-24 DIAGNOSIS — R399 Unspecified symptoms and signs involving the genitourinary system: Secondary | ICD-10-CM

## 2021-12-24 DIAGNOSIS — N898 Other specified noninflammatory disorders of vagina: Secondary | ICD-10-CM | POA: Diagnosis not present

## 2021-12-24 LAB — POCT WET PREP WITH KOH
Clue Cells Wet Prep HPF POC: POSITIVE
KOH Prep POC: NEGATIVE
Trichomonas, UA: NEGATIVE
Yeast Wet Prep HPF POC: NEGATIVE

## 2021-12-24 LAB — POCT URINALYSIS DIPSTICK
Bilirubin, UA: NEGATIVE
Blood, UA: NEGATIVE
Glucose, UA: NEGATIVE
Ketones, UA: NEGATIVE
Leukocytes, UA: NEGATIVE
Nitrite, UA: NEGATIVE
Protein, UA: NEGATIVE
Spec Grav, UA: 1.025 (ref 1.010–1.025)
pH, UA: 6 (ref 5.0–8.0)

## 2021-12-24 LAB — POCT URINE PREGNANCY: Preg Test, Ur: NEGATIVE

## 2021-12-24 MED ORDER — METRONIDAZOLE 500 MG PO TABS: ORAL_TABLET | ORAL | 0 refills | Status: DC

## 2021-12-24 MED ORDER — FLUCONAZOLE 150 MG PO TABS: 150.0000 mg | ORAL_TABLET | Freq: Once | ORAL | 0 refills | Status: AC

## 2021-12-24 MED ORDER — MISOPROSTOL 100 MCG PO TABS: 100.0000 ug | ORAL_TABLET | Freq: Once | ORAL | 0 refills | Status: DC

## 2021-12-24 NOTE — Telephone Encounter (Signed)
Please advise 

## 2021-12-24 NOTE — Progress Notes (Signed)
? ? ?Shannon Banana, MD ? ? ?Chief Complaint  ?Patient presents with  ? Contraception  ?  Nexplanon insertion  ? Vaginal Discharge  ?  Fishy odor, no itchiness x 5 days  ? ? ?HPI: ?     [name] is a 36 y.o. G3P0 whose LMP was Patient's last menstrual period was 12/04/2021 (approximate)., presents today for increased vag d/c with fishy odor, no irritation for 5-6 days; with urinary pressure/urgency with vag sx. Hx of BV in past. Also with painful area vaginal opening, looks red to pt. Went to ED due to vaginal pain and had neg wet prep. UA suggestive of UTI so treated with keflex, waiting on C&S results. They were also suspicious for PID so treated her with rocephin inj. Awaiting STD testing per pt (can't see in Epic).  ?Pt would also like nexplanon today. Was seen for annual 4/23 and had referral for TL. Pt doesn't want to do all that and would like nexplanon placed instead. Pt with hx of CVA/"brain bleed" and HTN. Nexpanon has Cat 3 recommendation per CDC for hx of CVA. Pt to see neuro tomorrow. Had Mirena IUD in past and did well for 3 yrs then started to have pain and had to have emergently removed. Was started on POPs at 4/23 annual. Has had irregular bleeding, new chin hair. Wants to be off pills.  ? ? ?Patient Active Problem List  ? Diagnosis Date Noted  ? Mixed hyperlipidemia 11/21/2021  ? Acute cough 11/21/2021  ? Annual physical exam 11/21/2021  ? Cerebrovascular accident (CVA) (HCC) 10/24/2021  ? Frequency of urination 10/24/2021  ? SAH (subarachnoid hemorrhage) (HCC) 08/10/2020  ? Missed abortion 11/17/2019  ? Bipolar disorder, currently in remission, most recent episode unspecified (HCC) 11/18/2016  ? Vitamin D deficiency 10/08/2016  ? 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 History of C-section 09/17/2016  ? History of severe pre-eclampsia 09/17/2016  ? Marijuana use 09/17/2016  ? Polysubstance abuse (HCC) 09/17/2016  ? Renal stones  09/17/2016  ? Tobacco abuse 09/17/2016  ? 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 Hypertension 12/05/2009  ? 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 Obsessive-compulsive disorder 11/07/2009  ? ? ?Past Surgical History:  ?Procedure Laterality Date  ? BRAIN SURGERY N/A 08/2020  ? CESAREAN SECTION    ? kidney stone N/A   ? ? ?Family History  ?Problem Relation Age of Onset  ? Hyperlipidemia Mother   ? Hypertension Mother   ? Hyperlipidemia Father   ? Brain cancer Paternal Aunt   ? ? ?Social History  ? ?Socioeconomic History  ? Marital status: Single  ?  Spouse name: Not on file  ? Number of children: Not on file  ? Years of education: Not on file  ? Highest education level: Not on file  ?Occupational History  ? Not on file  ?Tobacco Use  ? Smoking status: Every Day  ?  Types: Cigarettes  ? Smokeless tobacco: Never  ?Vaping Use  ? Vaping Use: Never used  ?Substance and Sexual Activity  ? Alcohol use: Not Currently  ? Drug use: Not Currently  ? Sexual activity: Yes  ?  Birth control/protection: Pill  ?Other Topics Concern  ? Not on file  ?Social History Narrative  ? Not on file  ? ?Social Determinants of Health  ? ?Financial Resource Strain: Not on file  ?Food Insecurity: Not on file  ?Transportation Needs: Not on file  ?  Physical Activity: Not on file  ?Stress: Not on file  ?Social Connections: Not on file  ?Intimate Partner Violence: Not on file  ? ? ?Outpatient Medications Prior to Visit  ?Medication Sig Dispense Refill  ? cephALEXin (KEFLEX) 500 MG capsule Take 500 mg by mouth 3 (three) times daily.    ? lisinopril (ZESTRIL) 10 MG tablet Take 1 tablet (10 mg total) by mouth daily. 30 tablet 1  ? norethindrone (ORTHO MICRONOR) 0.35 MG tablet Take 1 tablet (0.35 mg total) by mouth daily. 28 tablet 11  ? rosuvastatin (CRESTOR) 10 MG tablet Take 1 tablet (10 mg total) by mouth daily. 90 tablet 3  ? ?No facility-administered medications prior to visit.  ? ? ? ? ?ROS: ? ?Review of  Systems  ?Constitutional:  Negative for fever.  ?Gastrointestinal:  Negative for blood in stool, constipation, diarrhea, nausea and vomiting.  ?Genitourinary:  Positive for vaginal discharge. Negative for dyspareunia, dysuria, flank pain, frequency, hematuria, urgency, vaginal bleeding and vaginal pain.  ?Musculoskeletal:  Negative for back pain.  ?Skin:  Negative for rash.  ?BREAST: No symptoms ? ? ?OBJECTIVE:  ? ?Vitals:  ?BP 120/80   Ht 5\' 3"  (1.6 m)   Wt 138 lb (62.6 kg)   LMP 12/04/2021 (Approximate)   BMI 24.45 kg/m?  ? ?Physical Exam ?Vitals reviewed.  ?Constitutional:   ?   Appearance: She is well-developed.  ?Pulmonary:  ?   Effort: Pulmonary effort is normal.  ?Genitourinary: ?   General: Normal vulva.  ?   Pubic Area: No rash.   ?   Labia:     ?   Right: No rash, tenderness or lesion.     ?   Left: No rash, tenderness or lesion.   ?   Vagina: Vaginal discharge present. No erythema or tenderness.  ?   Cervix: Normal.  ?   Uterus: Normal. Not enlarged and not tender.   ?   Adnexa: Right adnexa normal and left adnexa normal.    ?   Right: No mass or tenderness.      ?   Left: No mass or tenderness.    ? ? ?   Comments: CX CLOSED AND UNABLE TO PENETRATE FOR IUD INSERTION ?Musculoskeletal:     ?   General: Normal range of motion.  ?   Cervical back: Normal range of motion.  ?Skin: ?   General: Skin is warm and dry.  ?Neurological:  ?   General: No focal deficit present.  ?   Mental Status: She is alert and oriented to person, place, and time.  ?Psychiatric:     ?   Mood and Affect: Mood normal.     ?   Behavior: Behavior normal.     ?   Thought Content: Thought content normal.     ?   Judgment: Judgment normal.  ? ? ?Results: ?Results for orders placed or performed in visit on 12/24/21 (from the past 24 hour(s))  ?POCT Urinalysis Dipstick     Status: Normal  ? Collection Time: 12/24/21  5:08 PM  ?Result Value Ref Range  ? Color, UA yellow   ? Clarity, UA clear   ? Glucose, UA Negative Negative  ?  Bilirubin, UA neg   ? Ketones, UA neg   ? Spec Grav, UA 1.025 1.010 - 1.025  ? Blood, UA neg   ? pH, UA 6.0 5.0 - 8.0  ? Protein, UA Negative Negative  ? Urobilinogen, UA    ? Nitrite,  UA neg   ? Leukocytes, UA Negative Negative  ? Appearance    ? Odor    ?POCT urine pregnancy     Status: Normal  ? Collection Time: 12/24/21  5:08 PM  ?Result Value Ref Range  ? Preg Test, Ur Negative Negative  ?POCT Wet Prep with KOH     Status: Abnormal  ? Collection Time: 12/24/21  5:09 PM  ?Result Value Ref Range  ? Trichomonas, UA Negative   ? Clue Cells Wet Prep HPF POC pos   ? Epithelial Wet Prep HPF POC    ? Yeast Wet Prep HPF POC neg   ? Bacteria Wet Prep HPF POC    ? RBC Wet Prep HPF POC    ? WBC Wet Prep HPF POC    ? KOH Prep POC Negative Negative  ? ? ?Assessment/Plan: ?BV (bacterial vaginosis) - Plan: metroNIDAZOLE (FLAGYL) 500 MG tablet, POCT Wet Prep with KOH; pos sx and wet prep. Rx flagyl, no EtOH. Will RF if sx recur. F/u prn.  ? ?Vaginal irritation - Plan: fluconazole (DIFLUCAN) 150 MG tablet; neg exam where pt is sore. Rx diflucan prn. Change to dove sens skin soap.  ? ?UTI symptoms - Plan: POCT Urinalysis Dipstick; neg UA, cont keflex while waiting for C&S results.  ? ?Encounter for initial prescription of intrauterine contraceptive device (IUD) - Plan: misoprostol (CYTOTEC) 100 MCG tablet, POCT urine pregnancy; neg UPT. Discussed prog only options and risks of depo and nexplanon with hx of CVA. Pt would like to try paragard. Insertion attempted but failed today. RTO with menses for insertion, Rx cytotec/NSAIDs. Pt can also try to get neuro MD clearance for depo or nexplanon tomorrow if wants them instead.  ? ? ?Meds ordered this encounter  ?Medications  ? metroNIDAZOLE (FLAGYL) 500 MG tablet  ?  Sig: Take 1 tab BID for 7 days; NO alcohol use for 10 days after prescription start  ?  Dispense:  14 tablet  ?  Refill:  0  ?  Order Specific Question:   Supervising Provider  ?  Answer:   CONSTANT, PEGGY [[id number]]  ?  fluconazole (DIFLUCAN) 150 MG tablet  ?  Sig: Take 1 tablet (150 mg total) by mouth once for 1 dose.  ?  Dispense:  1 tablet  ?  Refill:  0  ?  Order Specific Question:   Supervising Provider  ?  Answer:   CONSTANT, PEGGY [[id number]]

## 2021-12-25 ENCOUNTER — Ambulatory Visit: Payer: Medicaid Other | Admitting: Obstetrics

## 2021-12-25 ENCOUNTER — Telehealth: Payer: Self-pay

## 2021-12-25 ENCOUNTER — Encounter: Payer: Self-pay | Admitting: Obstetrics

## 2021-12-25 VITALS — BP 120/70 | Ht 63.0 in | Wt 140.0 lb

## 2021-12-25 DIAGNOSIS — Z30017 Encounter for initial prescription of implantable subdermal contraceptive: Secondary | ICD-10-CM | POA: Diagnosis not present

## 2021-12-25 NOTE — Telephone Encounter (Signed)
Patient is scheduled for 12/25/21 with [facility] for nexplanon placement. [facility]al[facility] per [facility]

## 2021-12-25 NOTE — Telephone Encounter (Signed)
Noted. Nexplanon available. ?

## 2021-12-25 NOTE — Progress Notes (Signed)
Chief Complaint  ?Patient presents with  ? Contraception  ? ? ?Shannon Ray is a 36 y.o. G3P0 Patient's last menstrual period was 12/04/2021 (approximate). who is here for nexplanon insertion.  The current method of family planning is micronor as she is a smoker over 81. She has no complaint today.  Pap is UTD.   ? ?PMH, PSH, FH and SH were all reviewed and updated.   ?Medications and allergies were reviewed and updated. ? ?BP 120/70   Ht 5\' 3"  (1.6 m)   Wt 140 lb (63.5 kg)   LMP 12/04/2021 (Approximate)   BMI 24.80 kg/m?  ? ?Procedure Note: Nexplanon insertion  ? ?Informed consent was obtained today for nexplanon insertion, with patient counseled in details re: indications, risks/benefits, and alternatives.  Specifically, patient is counseled re: the increased risk of irregular bleeding on Implanon.  Patient understands, all questions answered.   ? ?The left upper arm is used as location for nexplanon insertion.  The area is prepped with betadine.  Local anesthesia is administered using approximately 3 cc of 1% lidocaine.  A small stab incision was then made approximately 10 cm from the medial epicondyle 4 cm below the bicep groove. Nexplanon was sterilely inserted subcuticularly without difficulty. The implant was easily palpated. Hemostasis noted.  Incision was steri stripped and pressure dressing applied. ? ?Nexplanon insertion - Plan: Pregnancy, urine ? ?Plan:  ?Nexplanon placed successfully today. Patient tolerated procedure well without complication.  Patient to call if notice drainage from the incision, redness, or high fever; expect to have some light irregular which may last 2-3 months following the insertion.  ? ?Return in about 3 months (around 03/27/2022) for nexplanon follow up . ? ? ?

## 2021-12-25 NOTE — Patient Instructions (Signed)
[place] ?Call if you notice drainage from the incision, redness, or high fever. ?Expect to have some light bleeding which may last 2-3 months following the insertion. ?The Nexplanon does not protect you from STD's, so use condoms to protect yourself if necessary. ?etonogestrel (implant) ?Pronunciation:  e toe noe JES trel ?Brand:  Nexplanon ?What is the most important information I should know about etonogestrel? ?Do not use if you are pregnant or if you have recently had a baby.  ?You should not use an etonogestrel implant if you have:  undiagnosed vaginal bleeding, liver disease or liver cancer, if you will have major surgery, or if you have ever had a heart attack, a stroke, a blood clot, or cancer of the breast, uterus/cervix, or vagina. ?Using an etonogestrel implant can increase your risk of blood clots, stroke, or heart attack.   ?Smoking can greatly increase your risk of blood clots, stroke, or heart attack. You should not smoke while using an etonogestrel implant. ?What is etonogestrel implant? ?Etonogestrel implant is used as contraception to prevent pregnancy. The medicine is contained in a small plastic rod that is implanted into the skin of your upper arm. The medicine is released slowly into the body. The rod can remain in place and provide continuous contraception for up to 3 years. ?Etonogestrel implant may also be used for purposes not listed in this medication guide. ?What should I discuss with my healthcare provider before receiving the etonogestrel implant? ?Using an etonogestrel implant can increase your risk of blood clots, stroke, or heart attack.  You are even more at risk if you have high blood pressure, diabetes, high cholesterol, or if you are overweight. Your risk of stroke or blood clot is highest during your first year of using this medicine. ?Smoking can greatly increase your risk of blood clots, stroke, or heart attack. Your risk increases the older you are and the more you  smoke. ?Do not use if you are pregnant.  If you become pregnant, the etonogestrel implant should be removed if you plan to continue the pregnancy. ?You may need to have a negative pregnancy test before receiving the implant. ?You should not use hormonal birth control if you have: ?a history of heart attack, stroke, or blood clot; ?a history of hormone-related cancer, or cancer of the breast, uterus/cervix, or vagina; ?unusual vaginal bleeding that has not been checked by a doctor; or ?liver disease or liver cancer. ?Tell your doctor if you have ever had: ?diabetes; ?high cholesterol or triglycerides; ?high blood pressure; ?headaches; ?gallbladder disease; ?kidney disease; ?depression; or ?an allergy to numbing medicines. ?An etonogestrel implant may not be as effective in women who are overweight. ?The etonogestrel implant should not be used in girls younger than 38 years old. ?Etonogestrel can pass into breast milk, but effects on the nursing baby are not known. Tell your doctor if you are breast-feeding. ?How is the etonogestrel implant used? ?The timing of when you will receive this implant depends on whether you were using birth control before, and what type it was. ?Etonogestrel implant is inserted through a needle (under local anesthesia) into the skin of your upper arm, just inside and above the elbow. After the implant is inserted, your arm will be covered with 2 bandages. Remove the top bandage after 24 hours, but leave the smaller bandage on for 3 to 5 days. Keep the area clean and dry. ?You should be able to feel the implant under your skin. Tell your doctor if you cannot feel  the implant at any time while it is in place. ?Etonogestrel implant can remain in place for up to 3 years. If the implant is placed correctly, you will not need to use back-up birth control. Follow your doctor's instructions. ?You may have irregular and unpredictable periods while using the etonogestrel implant. Tell your doctor if  your periods are very heavy or long-lasting, or if you miss a period (you may be pregnant). ?If you need major surgery or will be on long-term bed rest, or if you need medical tests your etonogestrel implant may need to be removed for a short time. Any doctor or surgeon who treats you should know that you have an etonogestrel implant. ?Have regular physical exams and mammograms, and self-examine your breasts for lumps on a monthly basis while using this medicine. ?The etonogestrel implant must be removed by the end of the third year after it was inserted and may be replaced at that time with a new implant. After the implant is removed, your ability to get pregnant will return quickly. If the implant is not replaced with a new one, start using another form of birth control right away if you wish to prevent pregnancy. ?Call your doctor at once if it feels like the implant may be bent or broken while it is in your arm. ?What happens if I miss a dose? ?Since etonogestrel is given as an implant by a healthcare professional, you will not be on a frequent dosing schedule. Be sure to see your doctor for removal of the implant by the end of the third year. ?What happens if I overdose? ?If the implant is correctly inserted, an overdose of etonogestrel is highly unlikely. ?What should I avoid while taking etonogestrel implant? ?You should not smoke while using an etonogestrel implant.  ?Etonogestrel implant will not protect you from sexually transmitted diseases--including HIV and AIDS. Using a condom is the only way to protect yourself from these diseases. ?What are the possible side effects of etonogestrel implant? ?Get emergency medical help if you have signs of an allergic reaction: hives; difficult breathing; swelling of your face, lips, tongue, or throat. ?Call your doctor at once if you have: ?warmth, redness, swelling, or oozing where the implant was inserted; ?severe pain or cramping in your pelvic area (may be only  on one side); ?signs of a stroke --sudden numbness or weakness (especially on one side of the body), sudden severe headache, slurred speech, problems with vision or balance; ?signs of a blood clot --sudden vision loss, stabbing chest pain, feeling short of breath, coughing up blood, pain or warmth in one or both legs; ?heart attack symptoms --chest pain or pressure, pain spreading to your jaw or shoulder, nausea, sweating; ?increased blood pressure --severe headache, blurred vision, pounding in your neck or ears; ?swelling in your hands, ankles, or feet; ?jaundice (yellowing of the skin or eyes); ?a breast lump; or ?symptoms of depression --sleep problems, weakness, tired feeling, mood changes. ?Common side effects may include: ?pain where the implant was inserted; ?changes in your menstrual periods; ?vaginal itching or discharge; ?acne, mood changes, weight gain; ?back pain, menstrual cramps; ?nausea, stomach pain; ?breast pain; ?headache, dizziness; or ?flu-like symptoms, sore throat. ?This is not a complete list of side effects and others may occur. Call your doctor for medical advice about side effects. You may report side effects to FDA at 1-800-FDA-1088. ?What other drugs will affect etonogestrel implant? ?Certain other medicines or herbal products may make etonogestrel less effective, which could result  in pregnancy. You may need to use a non-hormonal form of back-up birth control (such as condoms with spermicide) while you are taking certain medicine, and for up to 28 days after stopping the medicine. ?Tell your doctor about all your other medicines, especially: ?aprepitant; ?bosentan; ?griseofulvin; ?rifampin; ?St. John's wort; ?topiramate; ?medicine to treat hepatitis C, HIV, or AIDS; ?a barbiturate --butabarbital, secobarbital, phenobarbital; or ?seizure medicine --carbamazepine, felbamate, oxcarbazepine, phenytoin. ?This list is not complete. Other drugs may affect etonogestrel, including prescription  and over-the-counter medicines, vitamins, and herbal products. Not all possible drug interactions are listed here. ?Where can I get more information? ?Your doctor or pharmacist can provide more information abou

## 2021-12-30 ENCOUNTER — Other Ambulatory Visit: Payer: Self-pay

## 2021-12-30 DIAGNOSIS — I10 Essential (primary) hypertension: Secondary | ICD-10-CM

## 2021-12-30 DIAGNOSIS — Z30017 Encounter for initial prescription of implantable subdermal contraceptive: Secondary | ICD-10-CM | POA: Diagnosis not present

## 2021-12-30 MED ORDER — ETONOGESTREL 68 MG ~~LOC~~ IMPL
68.0000 mg | DRUG_IMPLANT | Freq: Once | SUBCUTANEOUS | Status: AC
  Administered 2021-12-30: 68 mg via SUBCUTANEOUS

## 2021-12-30 MED ORDER — LISINOPRIL 10 MG PO TABS: 10.0000 mg | ORAL_TABLET | Freq: Every day | ORAL | 0 refills | Status: DC

## 2021-12-30 NOTE — Addendum Note (Signed)
Addended by: Liliane Shi on: 12/30/2021 12:05 PM   Modules accepted: Orders

## 2022-01-03 NOTE — Telephone Encounter (Signed)
Nexplanon received/charged 12/25/21.

## 2022-01-30 ENCOUNTER — Other Ambulatory Visit: Payer: Self-pay

## 2022-01-30 ENCOUNTER — Other Ambulatory Visit: Payer: Self-pay | Admitting: Family Medicine

## 2022-01-30 DIAGNOSIS — I10 Essential (primary) hypertension: Secondary | ICD-10-CM

## 2022-01-30 MED ORDER — LISINOPRIL 10 MG PO TABS: 10.0000 mg | ORAL_TABLET | Freq: Every day | ORAL | 0 refills | Status: DC

## 2022-02-20 ENCOUNTER — Ambulatory Visit: Payer: Medicaid Other | Admitting: Family Medicine

## 2022-02-27 ENCOUNTER — Ambulatory Visit: Payer: Medicaid Other | Admitting: Family Medicine

## 2022-03-04 ENCOUNTER — Other Ambulatory Visit: Payer: Self-pay | Admitting: Family Medicine

## 2022-03-04 ENCOUNTER — Other Ambulatory Visit: Payer: Self-pay

## 2022-03-04 DIAGNOSIS — I10 Essential (primary) hypertension: Secondary | ICD-10-CM

## 2022-03-05 ENCOUNTER — Other Ambulatory Visit: Payer: Self-pay | Admitting: Family Medicine

## 2022-03-05 DIAGNOSIS — I10 Essential (primary) hypertension: Secondary | ICD-10-CM

## 2022-03-11 ENCOUNTER — Ambulatory Visit: Payer: Medicaid Other | Admitting: Family Medicine

## 2022-03-13 ENCOUNTER — Encounter: Payer: Self-pay | Admitting: Family Medicine

## 2022-03-18 ENCOUNTER — Ambulatory Visit: Payer: Medicaid Other | Admitting: Family Medicine

## 2022-03-18 ENCOUNTER — Encounter: Payer: Self-pay | Admitting: Family Medicine

## 2022-03-18 VITALS — BP 124/80 | HR 88 | Ht 63.0 in | Wt 138.0 lb

## 2022-03-18 DIAGNOSIS — Z720 Tobacco use: Secondary | ICD-10-CM

## 2022-03-18 DIAGNOSIS — R350 Frequency of micturition: Secondary | ICD-10-CM

## 2022-03-18 DIAGNOSIS — S069X9D Unspecified intracranial injury with loss of consciousness of unspecified duration, subsequent encounter: Secondary | ICD-10-CM | POA: Diagnosis not present

## 2022-03-18 DIAGNOSIS — E782 Mixed hyperlipidemia: Secondary | ICD-10-CM

## 2022-03-18 DIAGNOSIS — I10 Essential (primary) hypertension: Secondary | ICD-10-CM | POA: Diagnosis not present

## 2022-03-18 NOTE — Progress Notes (Signed)
     Primary Care / Sports Medicine Office Visit  Patient Information:  Patient ID: Shannon Ray, female DOB: May 02, 1986 Age: 36 y.o. MRN: [id number]   Shannon Ray is a pleasant 36 y.o. female presenting with the following:  Chief Complaint  Patient presents with   medication follow up    Pt for follow-up to cholesterol.    Vitals:   03/18/22 1049  BP: 124/80  Pulse: 88  SpO2: 98%   Vitals:   03/18/22 1049  Weight: 138 lb (62.6 kg)  Height: 5\' 3"  (1.6 m)   Body mass index is 24.45 kg/m.  No results found.   Independent interpretation of notes and tests performed by another provider:   None  Procedures performed:   None  Pertinent History, Exam, Impression, and Recommendations:   Problem List Items Addressed This Visit       Cardiovascular and Mediastinum   Hypertension - Primary    Chronic condition that appears well-controlled on today's visit with lisinopril 10 mg daily.  She did have an opportunity to check in with cardiologist and was reassured with echocardiogram.  She was advised to follow-up as needed with that group.  Today's cardiopulmonary findings are benign on exam.  Recheck on risk stratification labs ordered today to help coordinate medication management.  She will return for routine follow-up in 6 months.      Relevant Orders   Comprehensive metabolic panel   Lipid panel   Apo A1 + B + Ratio   CBC     Nervous and Auditory   Brain injury (HCC)    Chronic condition, established with [facility] neurology and following with them.  Has been started on nortriptyline nightly, recommendations for possible need a prescription reason for management versus venlafaxine for anxiety treatment and disregarded and consideration of holding her simvastatin regarding persistent headaches.  Today she denies any headache, tolerating current medications well.  Will continue to follow peripherally on this issue.        Other   Frequency of urination    Acute on  chronic issue with prior E. coli UTI 12/2021, continues to note urinary frequency but otherwise asymptomatic.  Denies any new exposures, plan for urinalysis and urine culture, will follow results accordingly.      Relevant Orders   CBC   Urinalysis   Urine Culture   Mixed hyperlipidemia    Has been tolerating Crestor 10 mg well without issue, plan for recheck of lipids and possible titration down as clinically guided.      Relevant Orders   Comprehensive metabolic panel   Lipid panel   Apo A1 + B + Ratio   I provided a total time of 41 minutes including both face-to-face and non-face-to-face time on 03/18/2022 inclusive of time utilized for medical chart review, information gathering, care coordination with staff, and documentation completion.    Orders & Medications No orders of the defined types were placed in this encounter.  Orders Placed This Encounter  Procedures   Urine Culture   Comprehensive metabolic panel   Lipid panel   Apo A1 + B + Ratio   CBC   Urinalysis     Return in about 6 months (around 09/18/2022).     11/17/2022, MD   Primary Care Sports Medicine [facility]

## 2022-03-18 NOTE — Assessment & Plan Note (Signed)
Chronic smoker in the setting of prior CVA, tobacco cessation counseling and methods reviewed x 3 minutes, will readdress appropriately.

## 2022-03-18 NOTE — Patient Instructions (Signed)
-   Obtain fasting labs with orders provided (can have water or black coffee but otherwise no food or drink x 8 hours before labs) - Continue current medications - Return for follow-up in 6 months

## 2022-03-18 NOTE — Assessment & Plan Note (Signed)
Has been tolerating Crestor 10 mg well without issue, plan for recheck of lipids and possible titration down as clinically guided.

## 2022-03-18 NOTE — Assessment & Plan Note (Signed)
Chronic condition, established with [facility] neurolog[facility] and following with them.  Has been started on nortriptyline nightly, recommendations for possible need a prescription reason for management versus venlafaxine for anxiety treatment and disregarded and consideration of holding her simvastatin regarding persistent headaches.  Today she denies any headache, tolerating current medications well.  Will continue to follow peripherally on this issue.

## 2022-03-18 NOTE — Assessment & Plan Note (Signed)
Acute on chronic issue with prior E. coli UTI 12/2021, continues to note urinary frequency but otherwise asymptomatic.  Denies any new exposures, plan for urinalysis and urine culture, will follow results accordingly.

## 2022-03-18 NOTE — Assessment & Plan Note (Signed)
Chronic condition that appears well-controlled on today's visit with lisinopril 10 mg daily.  She did have an opportunity to check in with cardiologist and was reassured with echocardiogram.  She was advised to follow-up as needed with that group.  Today's cardiopulmonary findings are benign on exam.  Recheck on risk stratification labs ordered today to help coordinate medication management.  She will return for routine follow-up in 6 months.

## 2022-03-20 ENCOUNTER — Ambulatory Visit: Payer: Medicaid Other | Admitting: Nurse Practitioner

## 2022-03-21 LAB — COMPREHENSIVE METABOLIC PANEL
ALT: 16 IU/L (ref 0–32)
AST: 15 IU/L (ref 0–40)
Albumin/Globulin Ratio: 2.1 (ref 1.2–2.2)
Albumin: 4.9 g/dL (ref 3.9–4.9)
Alkaline Phosphatase: 62 IU/L (ref 44–121)
BUN/Creatinine Ratio: 20 (ref 9–23)
BUN: 17 mg/dL (ref 6–20)
Bilirubin Total: 0.5 mg/dL (ref 0.0–1.2)
CO2: 20 mmol/L (ref 20–29)
Calcium: 9.5 mg/dL (ref 8.7–10.2)
Chloride: 102 mmol/L (ref 96–106)
Creatinine, Ser: 0.85 mg/dL (ref 0.57–1.00)
Globulin, Total: 2.3 g/dL (ref 1.5–4.5)
Glucose: 81 mg/dL (ref 70–99)
Potassium: 4.3 mmol/L (ref 3.5–5.2)
Sodium: 138 mmol/L (ref 134–144)
Total Protein: 7.2 g/dL (ref 6.0–8.5)
eGFR: 90 mL/min/{1.73_m2} (ref >59)

## 2022-03-21 LAB — URINALYSIS
Bilirubin, UA: NEGATIVE
Glucose, UA: NEGATIVE
Ketones, UA: NEGATIVE
Leukocytes,UA: NEGATIVE
Nitrite, UA: NEGATIVE
Protein,UA: NEGATIVE
RBC, UA: NEGATIVE
Specific Gravity, UA: 1.023 (ref 1.005–1.030)
Urobilinogen, Ur: 0.2 mg/dL (ref 0.2–1.0)
pH, UA: 7 (ref 5.0–7.5)

## 2022-03-21 LAB — CBC
Hematocrit: 43 % (ref 34.0–46.6)
Hemoglobin: 14.2 g/dL (ref 11.1–15.9)
MCH: 32.2 pg (ref 26.6–33.0)
MCHC: 33 g/dL (ref 31.5–35.7)
MCV: 98 fL — ABNORMAL HIGH (ref 79–97)
Platelets: 238 10*3/uL (ref 150–450)
RBC: 4.41 x10E6/uL (ref 3.77–5.28)
RDW: 12.6 % (ref 11.7–15.4)
WBC: 7.6 10*3/uL (ref 3.4–10.8)

## 2022-03-21 LAB — LIPID PANEL
Chol/HDL Ratio: 2.6 ratio (ref 0.0–4.4)
Cholesterol, Total: 129 mg/dL (ref 100–199)
HDL: 49 mg/dL (ref >39)
LDL Chol Calc (NIH): 64 mg/dL (ref 0–99)
Triglycerides: 79 mg/dL (ref 0–149)
VLDL Cholesterol Cal: 16 mg/dL (ref 5–40)

## 2022-03-21 LAB — APO A1 + B + RATIO
Apolipo. B/A-1 Ratio: 0.5 ratio (ref 0.0–0.6)
Apolipoprotein A-1: 118 mg/dL (ref 116–209)
Apolipoprotein B: 64 mg/dL (ref <90)

## 2022-03-22 LAB — URINE CULTURE: Organism ID, Bacteria: NO GROWTH

## 2022-03-24 NOTE — Progress Notes (Signed)
duplicate

## 2022-03-26 LAB — SPECIMEN STATUS REPORT

## 2022-03-26 LAB — VITAMIN B12: Vitamin B-12: 238 pg/mL (ref 232–1245)

## 2022-03-27 ENCOUNTER — Ambulatory Visit: Payer: Medicaid Other | Admitting: Obstetrics and Gynecology

## 2022-04-02 ENCOUNTER — Other Ambulatory Visit: Payer: Self-pay | Admitting: Family Medicine

## 2022-04-02 DIAGNOSIS — I10 Essential (primary) hypertension: Secondary | ICD-10-CM

## 2022-04-03 MED ORDER — LISINOPRIL 10 MG PO TABS: 10.0000 mg | ORAL_TABLET | Freq: Every day | ORAL | 0 refills | Status: DC

## 2022-05-01 ENCOUNTER — Other Ambulatory Visit: Payer: Self-pay | Admitting: Family Medicine

## 2022-05-01 DIAGNOSIS — I10 Essential (primary) hypertension: Secondary | ICD-10-CM

## 2022-05-01 MED ORDER — LISINOPRIL 10 MG PO TABS: 10.0000 mg | ORAL_TABLET | Freq: Every day | ORAL | 0 refills | Status: DC

## 2022-06-05 ENCOUNTER — Other Ambulatory Visit: Payer: Self-pay | Admitting: Family Medicine

## 2022-06-05 DIAGNOSIS — I10 Essential (primary) hypertension: Secondary | ICD-10-CM

## 2022-06-05 MED ORDER — LISINOPRIL 10 MG PO TABS: 10.0000 mg | ORAL_TABLET | Freq: Every day | ORAL | 3 refills | Status: DC

## 2022-07-03 ENCOUNTER — Encounter: Payer: Self-pay | Admitting: Family Medicine

## 2022-08-01 ENCOUNTER — Other Ambulatory Visit: Payer: Self-pay

## 2022-08-01 DIAGNOSIS — I10 Essential (primary) hypertension: Secondary | ICD-10-CM

## 2022-08-01 MED ORDER — LISINOPRIL 10 MG PO TABS: 10.0000 mg | ORAL_TABLET | Freq: Every day | ORAL | 0 refills | Status: DC

## 2022-09-18 ENCOUNTER — Ambulatory Visit: Payer: Medicaid Other | Admitting: Family Medicine

## 2022-09-22 ENCOUNTER — Encounter: Payer: Self-pay | Admitting: Family Medicine

## 2022-09-22 ENCOUNTER — Ambulatory Visit: Payer: Medicaid Other | Admitting: Family Medicine

## 2022-09-22 VITALS — BP 142/102 | HR 88 | Ht 63.0 in | Wt 136.0 lb

## 2022-09-22 DIAGNOSIS — E782 Mixed hyperlipidemia: Secondary | ICD-10-CM

## 2022-09-22 DIAGNOSIS — E559 Vitamin D deficiency, unspecified: Secondary | ICD-10-CM

## 2022-09-22 DIAGNOSIS — G479 Sleep disorder, unspecified: Secondary | ICD-10-CM | POA: Diagnosis not present

## 2022-09-22 DIAGNOSIS — I639 Cerebral infarction, unspecified: Secondary | ICD-10-CM | POA: Diagnosis not present

## 2022-09-22 DIAGNOSIS — I10 Essential (primary) hypertension: Secondary | ICD-10-CM

## 2022-09-22 DIAGNOSIS — F418 Other specified anxiety disorders: Secondary | ICD-10-CM

## 2022-09-22 MED ORDER — NORTRIPTYLINE HCL 25 MG PO CAPS: 25.0000 mg | ORAL_CAPSULE | Freq: Every day | ORAL | 0 refills | Status: DC

## 2022-09-22 NOTE — Patient Instructions (Addendum)
-   Obtain labs today - Continue lisinopril and rosuvastatin - Start nortriptyline at bedtime - Work note provided - Return for follow-up in 2 months

## 2022-09-23 ENCOUNTER — Other Ambulatory Visit: Payer: Self-pay | Admitting: Family Medicine

## 2022-09-23 DIAGNOSIS — E559 Vitamin D deficiency, unspecified: Secondary | ICD-10-CM

## 2022-09-23 LAB — COMPREHENSIVE METABOLIC PANEL
ALT: 13 IU/L (ref 0–32)
AST: 18 IU/L (ref 0–40)
Albumin/Globulin Ratio: 2.1 (ref 1.2–2.2)
Albumin: 4.9 g/dL (ref 3.9–4.9)
Alkaline Phosphatase: 64 IU/L (ref 44–121)
BUN/Creatinine Ratio: 15 (ref 9–23)
BUN: 11 mg/dL (ref 6–20)
Bilirubin Total: 0.5 mg/dL (ref 0.0–1.2)
CO2: 21 mmol/L (ref 20–29)
Calcium: 9.8 mg/dL (ref 8.7–10.2)
Chloride: 103 mmol/L (ref 96–106)
Creatinine, Ser: 0.74 mg/dL (ref 0.57–1.00)
Globulin, Total: 2.3 g/dL (ref 1.5–4.5)
Glucose: 89 mg/dL (ref 70–99)
Potassium: 4.3 mmol/L (ref 3.5–5.2)
Sodium: 140 mmol/L (ref 134–144)
Total Protein: 7.2 g/dL (ref 6.0–8.5)
eGFR: 106 mL/min/{1.73_m2} (ref >59)

## 2022-09-23 LAB — CBC
Hematocrit: 41.9 % (ref 34.0–46.6)
Hemoglobin: 14 g/dL (ref 11.1–15.9)
MCH: 32.3 pg (ref 26.6–33.0)
MCHC: 33.4 g/dL (ref 31.5–35.7)
MCV: 97 fL (ref 79–97)
Platelets: 241 10*3/uL (ref 150–450)
RBC: 4.33 x10E6/uL (ref 3.77–5.28)
RDW: 11.8 % (ref 11.7–15.4)
WBC: 7 10*3/uL (ref 3.4–10.8)

## 2022-09-23 LAB — LIPID PANEL
Chol/HDL Ratio: 2.5 ratio (ref 0.0–4.4)
Cholesterol, Total: 117 mg/dL (ref 100–199)
HDL: 47 mg/dL (ref >39)
LDL Chol Calc (NIH): 46 mg/dL (ref 0–99)
Triglycerides: 139 mg/dL (ref 0–149)
VLDL Cholesterol Cal: 24 mg/dL (ref 5–40)

## 2022-09-23 LAB — TSH: TSH: 2.55 u[IU]/mL (ref 0.450–4.500)

## 2022-09-23 LAB — VITAMIN D 25 HYDROXY (VIT D DEFICIENCY, FRACTURES): Vit D, 25-Hydroxy: 14.7 ng/mL — ABNORMAL LOW (ref 30.0–100.0)

## 2022-09-23 MED ORDER — VITAMIN D (ERGOCALCIFEROL) 1.25 MG (50000 UNIT) PO CAPS: 50000.0000 [IU] | ORAL_CAPSULE | ORAL | 0 refills | Status: DC

## 2022-09-29 ENCOUNTER — Telehealth: Payer: Self-pay

## 2022-09-29 NOTE — Transitions of Care (Post Inpatient/ED Visit) (Signed)
   09/29/2022  Name: Shannon Ray MRN: [id number] DOB: [birth year]  Today's TOC FU Call Status: Today's TOC FU Call Status:: Successful TOC FU Call Competed TOC FU Call Complete Date: 09/29/22  Transition Care Management Follow-up Telephone Call Date of Discharge: 09/28/22 Discharge Facility: Other ([place]) Name of Other ([facility]) Discharge Facility: [facility] Type of Discharge: Emergency Department Reason for ED Visit: Other: (Dysuria) How have you been since you were released from the hospital?: Better Any questions or concerns?: No  Items Reviewed: Did you receive and understand the discharge instructions provided?: Yes Medications obtained and verified?: Yes (Medications Reviewed) Any new allergies since your discharge?: No Dietary orders reviewed?: No Do you have support at home?: Yes  Home Care and Equipment/Supplies: [place] Ordered?: NA Any new equipment or medical supplies ordered?: NA  Functional Questionnaire: Do you need assistance with bathing/showering or dressing?: No Do you need assistance with meal preparation?: No Do you need assistance with eating?: No Do you have difficulty maintaining continence: No Do you need assistance with getting out of bed/getting out of a chair/moving?: No Do you have difficulty managing or taking your medications?: No  Folllow up appointments reviewed: PCP Follow-up appointment confirmed?: No (pt changing PCP- will fu elsewhere) MD Provider Line Number:[phone] Given: Yes [facility] Follow-up appointment confirmed?: NA Do you need transportation to your follow-up appointment?: No Do you understand care options if your condition(s) worsen?: Yes-patient verbalized understanding    [place] LPN [place] [phone]

## 2022-10-02 DIAGNOSIS — G479 Sleep disorder, unspecified: Secondary | ICD-10-CM | POA: Insufficient documentation

## 2022-10-02 DIAGNOSIS — F418 Other specified anxiety disorders: Secondary | ICD-10-CM | POA: Insufficient documentation

## 2022-10-02 NOTE — Assessment & Plan Note (Signed)
Chronic, uncontrolled, patient has not been compliant with medications. She denies any cardiopulmonary complaints. Examination without JVD, +S1, S2, RRR, no MRG, CTAbl without WRR.  Discussed medication compliance and risk stratification labs ordered, close follow-up advised.

## 2022-10-02 NOTE — Assessment & Plan Note (Signed)
Given comorbid medical conditions discussed pharmacotherapy options and will start nortriptyline, close follow-up.

## 2022-10-02 NOTE — Progress Notes (Signed)
     Primary Care / Sports Medicine Office Visit  Patient Information:  Patient ID: Shannon Ray, female DOB: [birth year] Age: 37 y.o. MRN: [id number]   Shannon Ray is a pleasant 37 y.o. female presenting with the following:  Chief Complaint  Patient presents with   Hypertension   Fatigue    Vitals:   09/22/22 0806 09/22/22 0817  BP: (!) 140/110 (!) 142/102  Pulse: 88   SpO2: 97%    Vitals:   09/22/22 0806  Weight: 136 lb (61.7 kg)  Height: 5' 3"$  (1.6 m)   Body mass index is 24.09 kg/m.  No results found.   Independent interpretation of notes and tests performed by another provider:   None  Procedures performed:   None  Pertinent History, Exam, Impression, and Recommendations:   Shannon Ray was seen today for hypertension and fatigue.  Hypertension, unspecified type Assessment & Plan: Chronic, uncontrolled, patient has not been compliant with medications. She denies any cardiopulmonary complaints. Examination without JVD, +S1, S2, RRR, no MRG, CTAbl without WRR.  Discussed medication compliance and risk stratification labs ordered, close follow-up advised.  Orders: -     CBC -     Comprehensive metabolic panel -     Lipid panel -     TSH -     VITAMIN D 25 Hydroxy (Vit-D Deficiency, Fractures)  Sleep disturbance Assessment & Plan: Given comorbid medical conditions discussed pharmacotherapy options and will start nortriptyline, close follow-up.  Orders: -     CBC -     Comprehensive metabolic panel -     TSH  Mixed hyperlipidemia -     Comprehensive metabolic panel -     Lipid panel  Cerebrovascular accident (CVA), unspecified mechanism ([place]) -     CBC -     Comprehensive metabolic panel -     Lipid panel -     TSH -     VITAMIN D 25 Hydroxy (Vit-D Deficiency, Fractures)  Vitamin D deficiency -     VITAMIN D 25 Hydroxy (Vit-D Deficiency, Fractures)  Depression with anxiety Assessment & Plan: See additional assessment(s) for plan  details.  Orders: -     Nortriptyline HCl; Take 1 capsule (25 mg total) by mouth at bedtime. One capsule by mouth each bedtime for a week then 2 capsules at bedtime.  Dispense: 60 capsule; Refill: 0   I provided a total time of 42 minutes including both face-to-face and non-face-to-face time on 10/02/2022 inclusive of time utilized for medical chart review, information gathering, care coordination with staff, and documentation completion.   Orders & Medications Meds ordered this encounter  Medications   nortriptyline (PAMELOR) 25 MG capsule    Sig: Take 1 capsule (25 mg total) by mouth at bedtime. One capsule by mouth each bedtime for a week then 2 capsules at bedtime.    Dispense:  60 capsule    Refill:  0   Orders Placed This Encounter  Procedures   CBC   Comprehensive metabolic panel   Lipid panel   TSH   VITAMIN D 25 Hydroxy (Vit-D Deficiency, Fractures)     Return in about 2 months (around 11/21/2022).     Montel Culver, MD, [facility]   Primary Care Sports Medicine Primary Care and Sports Medicine at [facility]

## 2022-10-02 NOTE — Assessment & Plan Note (Signed)
See additional assessment(s) for plan details. 

## 2022-10-06 ENCOUNTER — Telehealth: Payer: Self-pay

## 2022-10-06 NOTE — Transitions of Care (Post Inpatient/ED Visit) (Signed)
   10/06/2022  Name: Shannon Ray MRN: [id number] DOB: 1986/05/29  Today's TOC FU Call Status: Today's TOC FU Call Status:: Successful TOC FU Call Competed TOC FU Call Complete Date: 10/06/22  Transition Care Management Follow-up Telephone Call Date of Discharge: 10/04/22 Discharge Facility: Other ([place]) Name of Other ([facility]) Discharge Facility: [facility] Type of Discharge: Emergency Department Reason for ED Visit: Other: (Dysuria) How have you been since you were released from the hospital?: Better Any questions or concerns?: No  Items Reviewed: Did you receive and understand the discharge instructions provided?: Yes Medications obtained and verified?: Yes (Medications Reviewed) Any new allergies since your discharge?: No Dietary orders reviewed?: Yes Do you have support at home?: Yes  Home Care and Equipment/Supplies: [place] Ordered?: No Any new equipment or medical supplies ordered?: No  Functional Questionnaire: Do you need assistance with bathing/showering or dressing?: No Do you need assistance with meal preparation?: No Do you need assistance with eating?: No Do you have difficulty maintaining continence: No Do you need assistance with getting out of bed/getting out of a chair/moving?: No Do you have difficulty managing or taking your medications?: No  Folllow up appointments reviewed: PCP Follow-up appointment confirmed?: No (pt will follow up with urgent care closer to her home) MD Provider Line Number:[phone] Given: Yes [facility] Follow-up appointment confirmed?: NA Do you need transportation to your follow-up appointment?: No Do you understand care options if your condition(s) worsen?: Yes-patient verbalized understanding    [place] LPN [place] Direct Dial [phone]

## 2022-10-17 ENCOUNTER — Telehealth: Payer: Self-pay

## 2022-10-17 NOTE — Transitions of Care (Post Inpatient/ED Visit) (Signed)
   10/17/2022  Name: Shannon Ray MRN: [id number] DOB: [birth year]  Today's TOC FU Call Status: Today's TOC FU Call Status:: Unsuccessul Call (1st Attempt) Unsuccessful Call (1st Attempt) Date: 10/17/22  Attempted to reach the patient regarding the most recent Inpatient/ED visit.  Follow Up Plan: Additional outreach attempts will be made to reach the patient to complete the Transitions of Care (Post Inpatient/ED visit) call.   Signature kgates

## 2022-10-22 ENCOUNTER — Telehealth: Payer: Self-pay

## 2022-10-22 NOTE — Transitions of Care (Post Inpatient/ED Visit) (Unsigned)
   10/22/2022  Name: Shannon Ray MRN: [id number] DOB: October 29, 1985  Today's TOC FU Call Status: Today's TOC FU Call Status:: Unsuccessful Call (2nd Attempt) Unsuccessful Call (1st Attempt) Date: 10/17/22  Attempted to reach the patient regarding the most recent Inpatient/ED visit.  Follow Up Plan: Additional outreach attempts will be made to reach the patient to complete the Transitions of Care (Post Inpatient/ED visit) call.   Signature [employer], CMA

## 2022-10-23 ENCOUNTER — Telehealth: Payer: Self-pay

## 2022-10-23 NOTE — Transitions of Care (Post Inpatient/ED Visit) (Signed)
   10/23/2022  Name: Shannon Ray MRN: [id number] DOB: 1986/06/13  Today's TOC FU Call Status:    Attempted to reach the patient regarding the most recent Inpatient/ED visit.  Follow Up Plan: No further outreach attempts will be made at this time. We have been unable to contact the patient.  Signature [employer], CMA

## 2022-10-30 ENCOUNTER — Encounter: Payer: Self-pay | Admitting: Family Medicine

## 2022-10-30 ENCOUNTER — Other Ambulatory Visit: Payer: Self-pay | Admitting: Family Medicine

## 2022-10-30 DIAGNOSIS — I10 Essential (primary) hypertension: Secondary | ICD-10-CM

## 2022-10-31 ENCOUNTER — Other Ambulatory Visit: Payer: Self-pay

## 2022-10-31 DIAGNOSIS — E782 Mixed hyperlipidemia: Secondary | ICD-10-CM

## 2022-10-31 MED ORDER — ROSUVASTATIN CALCIUM 10 MG PO TABS: 10.0000 mg | ORAL_TABLET | Freq: Every day | ORAL | 0 refills | Status: DC

## 2022-10-31 MED ORDER — LISINOPRIL 10 MG PO TABS: 10.0000 mg | ORAL_TABLET | Freq: Every day | ORAL | 0 refills | Status: DC

## 2022-11-13 ENCOUNTER — Other Ambulatory Visit: Payer: Self-pay | Admitting: Family Medicine

## 2022-11-13 DIAGNOSIS — E559 Vitamin D deficiency, unspecified: Secondary | ICD-10-CM

## 2022-11-13 NOTE — Telephone Encounter (Signed)
Requested medication (s) are due for refill today: yes  Requested medication (s) are on the active medication list: yes  Last refill:  09/23/21  Future visit scheduled: yes  Notes to clinic:  Manual Review: Route requests for 50,000 IU strength to the provider.     Requested Prescriptions  Pending Prescriptions Disp Refills   Vitamin D, Ergocalciferol, (DRISDOL) 1.25 MG (50000 UNIT) CAPS capsule [Pharmacy Med Name: Vitamin D (Ergocalciferol) 1.25 MG (50000 UT) Oral Capsule] 8 capsule 0    Sig: Take 1 capsule by mouth once a week     Endocrinology:  Vitamins - Vitamin D Supplementation 2 Failed - 11/13/2022  9:13 AM      Failed - Manual Review: Route requests for 50,000 IU strength to the provider      Failed - Vitamin D in normal range and within 360 days    Vit D, 25-Hydroxy  Date Value Ref Range Status  09/22/2022 14.7 (L) 30.0 - 100.0 ng/mL Final    Comment:    Vitamin D deficiency has been defined by the Institute of Medicine and an Endocrine Society practice guideline as a level of serum 25-OH vitamin D less than 20 ng/mL (1,2). The Endocrine Society went on to further define vitamin D insufficiency as a level between 21 and 29 ng/mL (2). 1. IOM (Institute of Medicine). 2010. Dietary reference    intakes for calcium and D. [place]: The    [employer]. 2. Holick MF, Binkley [place], Bischoff-Ferrari HA, et al.    Evaluation, treatment, and prevention of vitamin D    deficiency: an Endocrine Society clinical practice    guideline. JCEM. 2011 Jul; 96(7):1911-30.          Passed - Ca in normal range and within 360 days    Calcium  Date Value Ref Range Status  09/22/2022 9.8 8.7 - 10.2 mg/dL Final         Passed - Valid encounter within last 12 months    Recent Outpatient Visits           1 month ago Hypertension, unspecified type   [facility] [place] at [place], Earley Abide, MD   8 months ago Hypertension,  unspecified type   [place] at [place], Earley Abide, MD   11 months ago Annual physical exam   [place] at [place], Earley Abide, MD   1 year ago Cerebrovascular accident (CVA), unspecified mechanism [facility])   [place] at [facility], Earley Abide, MD       Future Appointments             In 1 week Zigmund Daniel, Earley Abide, MD [place] at [facility], [facility]

## 2022-11-15 ENCOUNTER — Other Ambulatory Visit: Payer: Self-pay | Admitting: Family Medicine

## 2022-11-15 DIAGNOSIS — E559 Vitamin D deficiency, unspecified: Secondary | ICD-10-CM

## 2022-11-17 ENCOUNTER — Other Ambulatory Visit: Payer: Self-pay | Admitting: Family Medicine

## 2022-11-17 DIAGNOSIS — F418 Other specified anxiety disorders: Secondary | ICD-10-CM

## 2022-11-17 DIAGNOSIS — E559 Vitamin D deficiency, unspecified: Secondary | ICD-10-CM

## 2022-11-17 MED ORDER — NORTRIPTYLINE HCL 25 MG PO CAPS: 25.0000 mg | ORAL_CAPSULE | Freq: Every day | ORAL | 0 refills | Status: DC

## 2022-11-21 ENCOUNTER — Ambulatory Visit: Payer: Medicaid Other | Admitting: Family Medicine

## 2022-11-25 ENCOUNTER — Encounter: Payer: Self-pay | Admitting: Family Medicine

## 2022-11-25 ENCOUNTER — Ambulatory Visit: Payer: Medicaid Other | Admitting: Family Medicine

## 2022-11-25 VITALS — BP 140/90 | HR 76 | Ht 63.0 in | Wt 144.0 lb

## 2022-11-25 DIAGNOSIS — I10 Essential (primary) hypertension: Secondary | ICD-10-CM

## 2022-11-25 DIAGNOSIS — Z720 Tobacco use: Secondary | ICD-10-CM

## 2022-11-25 MED ORDER — LISINOPRIL 20 MG PO TABS: 10.0000 mg | ORAL_TABLET | Freq: Every day | ORAL | 1 refills | Status: DC

## 2022-11-25 MED ORDER — BUPROPION HCL ER (SR) 150 MG PO TB12: ORAL_TABLET | ORAL | 0 refills | Status: AC

## 2022-11-25 NOTE — Progress Notes (Signed)
     Primary Care / Sports Medicine Office Visit  Patient Information:  Patient ID: Shannon Ray, female DOB: 23-Oct-1985 Age: 37 y.o. MRN: [id number]   Shannon Ray is a pleasant 37 y.o. female presenting with the following:  Chief Complaint  Patient presents with   Hypertension    Vitals:   11/25/22 0804 11/25/22 0806  BP: (!) 142/90 (!) 140/90  Pulse: 76   SpO2: 97%    Vitals:   11/25/22 0804  Weight: 144 lb (65.3 kg)  Height:  (1.6 m)   Body mass index is 25.51 kg/m.  No results found.   Independent interpretation of notes and tests performed by another provider:   None  Procedures performed:   None  Pertinent History, Exam, Impression, and Recommendations:   Shannon Ray was seen today for hypertension.  Hypertension, unspecified type Assessment & Plan: Chronic, uncontrolled, denies any cardiopulmonary complaints and her examination that regard is benign.  Some concerns regarding compliance which were addressed with recommendations to keep all medications and and easily accessible and frequented a location (bathroom sink).  - Titrate lisinopril to 20 mg daily - Patient will find local areas to check BP and send updates via MyChart due to 1 hour plus drive to our location - Follow-up in 5 months  Orders: -     Lisinopril; Take 0.5 tablets (10 mg total) by mouth daily.  Dispense: 90 tablet; Refill: 1  Tobacco abuse Assessment & Plan: Chronic condition, patient has been attempting tobacco cessation independently with mild success.  In the setting of multiple comorbid conditions we revisited tobacco cessation.  -Start bupropion tobacco cessation regimen  Orders: -     buPROPion HCl ER (SR); Take 150 mg PO once daily for 3 days then increase to 150 mg PO twice daily. Plan to stop smoking 7 days after starting medication.  Dispense: 180 tablet; Refill: 0     Orders & Medications Meds ordered this encounter  Medications   buPROPion (WELLBUTRIN SR) 150  MG 12 hr tablet    Sig: Take 150 mg PO once daily for 3 days then increase to 150 mg PO twice daily. Plan to stop smoking 7 days after starting medication.    Dispense:  180 tablet    Refill:  0   lisinopril (ZESTRIL) 20 MG tablet    Sig: Take 0.5 tablets (10 mg total) by mouth daily.    Dispense:  90 tablet    Refill:  1   No orders of the defined types were placed in this encounter.    No follow-ups on file.     [name], MD, [facility]   Primary Care Sports Medicine Primary Care and Sports Medicine at [facility]

## 2022-11-25 NOTE — Patient Instructions (Addendum)
-   Start new dose of lisinopril 20 mg - Start new bupropion/Wellbutrin to quit smoking, take once daily x 3 days then twice daily for the remaining medication - Check your blood pressure at a local side ([facility], [facility], etc.) and send [place] a MyChart message with those readings - Return in 5 months

## 2022-11-25 NOTE — Assessment & Plan Note (Signed)
Chronic, uncontrolled, denies any cardiopulmonary complaints and her examination that regard is benign.  Some concerns regarding compliance which were addressed with recommendations to keep all medications and and easily accessible and frequented a location (bathroom sink).  - Titrate lisinopril to 20 mg daily - Patient will find local areas to check BP and send updates via MyChart due to 1 hour plus drive to our location - Follow-up in 5 months

## 2022-11-25 NOTE — Assessment & Plan Note (Signed)
Chronic condition, patient has been attempting tobacco cessation independently with mild success.  In the setting of multiple comorbid conditions we revisited tobacco cessation.  -Start bupropion tobacco cessation regimen

## 2022-12-11 ENCOUNTER — Telehealth: Payer: Self-pay | Admitting: Family Medicine

## 2022-12-11 NOTE — Telephone Encounter (Signed)
Please advise pt on dosing.

## 2022-12-11 NOTE — Telephone Encounter (Signed)
Copied from CRM [id number]. Topic: General - Other >> Dec 11, 2022  1:01 PM Everette C wrote: Reason for CRM: The patient would like to be contacted by a member of clinical staff when possible to further discuss the directions for their lisinopril (ZESTRIL) 20 MG tablet [[id number]] prescription   Please contact the patient further when possible

## 2022-12-12 NOTE — Telephone Encounter (Signed)
PC to pt, informed of how to take medicaiton. Voiced understanding.

## 2022-12-15 ENCOUNTER — Other Ambulatory Visit: Payer: Self-pay | Admitting: Family Medicine

## 2022-12-15 DIAGNOSIS — F418 Other specified anxiety disorders: Secondary | ICD-10-CM

## 2022-12-16 NOTE — Telephone Encounter (Signed)
Requested Prescriptions  Pending Prescriptions Disp Refills   nortriptyline (PAMELOR) 25 MG capsule [Pharmacy Med Name: Nortriptyline HCl 25 MG Oral Capsule] 180 capsule 0    Sig: TAKE 1 CAPSULE BY MOUTH AT BEDTIME FOR 7 DAYS, THEN 2 AT BEDTIME     Psychiatry:  Antidepressants - Heterocyclics (TCAs) Passed - 12/15/2022  9:17 AM      Passed - Completed PHQ-2 or PHQ-9 in the last 360 days      Passed - Valid encounter within last 6 months    Recent Outpatient Visits           3 weeks ago Hypertension, unspecified type   [place] [facility] at [facility], Ocie Bob, MD   2 months ago Hypertension, unspecified type   [facility] Primary Care & Sports Medicine at [facility], Ocie Bob, MD   9 months ago Hypertension, unspecified type   [facility] Primary Care & Sports Medicine at [facility], Ocie Bob, MD   1 year ago Annual physical exam   [facility] Primary Care & Sports Medicine at [facility], Ocie Bob, MD   1 year ago Cerebrovascular accident (CVA), unspecified mechanism [facility])   [place] Primary Care & Sports Medicine at [place], Ocie Bob, MD       Future Appointments             In 4 months Ashley Royalty, Ocie Bob, MD [facility] Primary Care & Sports Medicine at [facility], [facility]

## 2023-01-05 ENCOUNTER — Encounter: Payer: Self-pay | Admitting: Family Medicine

## 2023-01-06 ENCOUNTER — Other Ambulatory Visit: Payer: Self-pay

## 2023-01-06 ENCOUNTER — Telehealth: Payer: Self-pay | Admitting: Family Medicine

## 2023-01-06 DIAGNOSIS — I10 Essential (primary) hypertension: Secondary | ICD-10-CM

## 2023-01-06 MED ORDER — LISINOPRIL 20 MG PO TABS: 20.0000 mg | ORAL_TABLET | Freq: Every day | ORAL | 1 refills | Status: AC

## 2023-01-06 NOTE — Telephone Encounter (Signed)
Refill instructions was changed and sent to pharmacy.  KP

## 2023-01-06 NOTE — Telephone Encounter (Signed)
Medication Refill - Medication: lisinopril (ZESTRIL) 20 MG tablet   Has the patient contacted their pharmacy? Yes.   Provider needs to change the instructions as they can not refill until then. Patient stated the instructions should indicate take one  20mg  tablet by mouth daily  Preferred Pharmacy (with phone number or street name):  [facility] [address], [place] - [place] [address] [place] [address] Phone: [phone]  Fax: [phone]     Has the patient been seen for an appointment in the last year OR does the patient have an upcoming appointment? No.  Agent: Please be advised that RX refills may take up to 3 business days. We ask that you follow-up with your pharmacy.

## 2023-01-23 ENCOUNTER — Other Ambulatory Visit: Payer: Self-pay | Admitting: Family Medicine

## 2023-01-23 DIAGNOSIS — E782 Mixed hyperlipidemia: Secondary | ICD-10-CM

## 2023-01-23 NOTE — Telephone Encounter (Signed)
Requested Prescriptions  Pending Prescriptions Disp Refills   rosuvastatin (CRESTOR) 10 MG tablet [Pharmacy Med Name: Rosuvastatin Calcium 10 MG Oral Tablet] 90 tablet 0    Sig: Take 1 tablet by mouth once daily     Cardiovascular:  Antilipid - Statins 2 Failed - 01/23/2023 11:28 AM      Failed - Lipid Panel in normal range within the last 12 months    Cholesterol, Total  Date Value Ref Range Status  09/22/2022 117 100 - 199 mg/dL Final   LDL Chol Calc (NIH)  Date Value Ref Range Status  09/22/2022 46 0 - 99 mg/dL Final   HDL  Date Value Ref Range Status  09/22/2022 47 >39 mg/dL Final   Triglycerides  Date Value Ref Range Status  09/22/2022 139 0 - 149 mg/dL Final         Passed - Cr in normal range and within 360 days    Creatinine, Ser  Date Value Ref Range Status  09/22/2022 0.74 0.57 - 1.00 mg/dL Final         Passed - Patient is not pregnant      Passed - Valid encounter within last 12 months    Recent Outpatient Visits           1 month ago Hypertension, unspecified type   [place] [facility] & Sports Medicine at [facility], Ocie Bob, MD   4 months ago Hypertension, unspecified type   [facility] Primary Care & Sports Medicine at [facility], Ocie Bob, MD   10 months ago Hypertension, unspecified type   [facility] Primary Care & Sports Medicine at [facility], Ocie Bob, MD   1 year ago Annual physical exam   [facility] Primary Care & Sports Medicine at [facility], Ocie Bob, MD   1 year ago Cerebrovascular accident (CVA), unspecified mechanism [facility])   [place] Primary Care & Sports Medicine at [facility], Ocie Bob, MD       Future Appointments             In 3 months Ashley Royalty, Ocie Bob, MD [facility] Primary Care & Sports Medicine at [facility], [facility]

## 2023-03-02 ENCOUNTER — Telehealth: Payer: Self-pay

## 2023-03-02 NOTE — Transitions of Care (Post Inpatient/ED Visit) (Signed)
   03/02/2023  Name: Shannon Ray MRN: [id number] DOB: 1986-01-10  Today's TOC FU Call Status: Today's TOC FU Call Status:: Successful TOC FU Call Competed TOC FU Call Complete Date: 03/02/23  Transition Care Management Follow-up Telephone Call Date of Discharge: 03/02/23 Discharge Facility: Other [facility]) Name of Other (Non[facility]) Discharge Facility: [facility] Type of Discharge: Emergency Department Reason for ED Visit: Other: How have you been since you were released from the hospital?: Same Any questions or concerns?: No  Items Reviewed: Did you receive and understand the discharge instructions provided?: Yes Medications obtained,verified, and reconciled?: Yes (Medications Reviewed) Any new allergies since your discharge?: No Dietary orders reviewed?: No Do you have support at home?: Yes People in Home: friend(s)  Medications Reviewed Today: Medications Reviewed Today     Reviewed by [name], MD (Physician) on 11/25/22 at [phone]  Med List Status: <None>   Medication Order Taking? Sig Documenting Provider Last Dose Status Informant  buPROPion (WELLBUTRIN SR) 150 MG 12 hr tablet [id number] Yes Take 150 mg PO once daily for 3 days then increase to 150 mg PO twice daily. Plan to stop smoking 7 days after starting medication. [name], MD  Active   etonogestrel (NEXPLANON) 68 MG IMPL implant [id number] Yes Inject into the skin. [provider] Taking Active   hydrOXYzine (VISTARIL) 25 MG capsule [id number] Yes Take by mouth. [provider] Taking Active   lisinopril (ZESTRIL) 20 MG tablet [id number]  Take 0.5 tablets (10 mg total) by mouth daily. [name], MD  Active   nortriptyline (PAMELOR) 25 MG capsule [id number] Yes Take 1 capsule (25 mg total) by mouth at bedtime. One capsule by mouth each bedtime for a week then 2 capsules at bedtime. [name], MD Taking Active   rosuvastatin  (CRESTOR) 10 MG tablet [id number] Yes Take 1 tablet (10 mg total) by mouth daily. [name], MD Taking Active             Home Care and Equipment/Supplies: Were Home Health Services Ordered?: No Any new equipment or medical supplies ordered?: No  Functional Questionnaire: Do you need assistance with bathing/showering or dressing?: No Do you need assistance with meal preparation?: No Do you need assistance with eating?: No Do you have difficulty maintaining continence: No Do you need assistance with getting out of bed/getting out of a chair/moving?: No Do you have difficulty managing or taking your medications?: No  Follow up appointments reviewed: PCP Follow-up appointment confirmed?: No (PT refused) MD Provider Line Number:[phone] Given: Yes Specialist Hospital Follow-up appointment confirmed?: No Do you need transportation to your follow-up appointment?: No Do you understand care options if your condition(s) worsen?: Yes-patient verbalized understanding    SIGNATURE

## 2023-03-18 ENCOUNTER — Other Ambulatory Visit: Payer: Self-pay | Admitting: Family Medicine

## 2023-03-18 DIAGNOSIS — F418 Other specified anxiety disorders: Secondary | ICD-10-CM

## 2023-03-19 NOTE — Telephone Encounter (Signed)
Requested Prescriptions  Pending Prescriptions Disp Refills   nortriptyline (PAMELOR) 25 MG capsule [Pharmacy Med Name: Nortriptyline HCl 25 MG Oral Capsule] 180 capsule 0    Sig: TAKE 1 CAPSULE BY MOUTH AT BEDTIME FOR  7  DAYS,  THEN  TAKE  2  CAPSULES  AT  BEDTIME     Psychiatry:  Antidepressants - Heterocyclics (TCAs) Passed - 03/18/2023 10:48 AM      Passed - Completed PHQ-2 or PHQ-9 in the last 360 days      Passed - Valid encounter within last 6 months    Recent Outpatient Visits           3 months ago Hypertension, unspecified type   [place] Primary [facility] & [facility] Medicine at [facility], Ocie Bob, MD   5 months ago Hypertension, unspecified type   [facility] Primary Care & Sports Medicine at [facility], Ocie Bob, MD   1 year ago Hypertension, unspecified type   [facility] Medicine at [facility], Ocie Bob, MD   1 year ago Annual physical exam   [facility] Primary Care & Sports Medicine at [facility], Ocie Bob, MD   1 year ago Cerebrovascular accident (CVA), unspecified mechanism [facility])   [place] Primary Care & Sports Medicine at [facility], Ocie Bob, MD       Future Appointments             In 1 month Ashley Royalty, Ocie Bob, MD [facility] Primary Care & Sports Medicine at [facility], [facility]

## 2023-04-27 ENCOUNTER — Encounter: Payer: Medicaid Other | Admitting: Family Medicine

## 2023-04-27 ENCOUNTER — Telehealth: Payer: Self-pay | Admitting: Family Medicine

## 2023-04-27 NOTE — Telephone Encounter (Signed)
Called Patient to reschedule missed CPE appt she had this morning. Patient stated that she has switched Providers and no longer attend here. Patient stated that we are over an hour from her and someone is suppose to send for medical records. FYI.

## 2023-04-27 NOTE — Telephone Encounter (Signed)
Noted  Records will be faxed once medical release form Is received.  KP
